# Patient Record
Sex: Male | Born: 1995 | Race: White | Hispanic: No | Marital: Married
Health system: Midwestern US, Community
[De-identification: ages and names within clinical notes are randomized; demographics above are authoritative.]

## PROBLEM LIST (undated history)

## (undated) DIAGNOSIS — A6920 Lyme disease, unspecified: Secondary | ICD-10-CM

## (undated) DIAGNOSIS — K219 Gastro-esophageal reflux disease without esophagitis: Secondary | ICD-10-CM

## (undated) DIAGNOSIS — Z87442 Personal history of urinary calculi: Secondary | ICD-10-CM

## (undated) DIAGNOSIS — L659 Nonscarring hair loss, unspecified: Secondary | ICD-10-CM

## (undated) DIAGNOSIS — F840 Autistic disorder: Secondary | ICD-10-CM

## (undated) DIAGNOSIS — F909 Attention-deficit hyperactivity disorder, unspecified type: Secondary | ICD-10-CM

## (undated) DIAGNOSIS — E039 Hypothyroidism, unspecified: Secondary | ICD-10-CM

## (undated) HISTORY — PX: WISDOM TOOTH EXTRACTION: SHX21

## (undated) HISTORY — DX: Attention-deficit hyperactivity disorder, unspecified type: F90.9

## (undated) HISTORY — DX: Lyme disease, unspecified: A69.20

---

## 2019-09-10 DIAGNOSIS — F840 Autistic disorder: Secondary | ICD-10-CM | POA: Insufficient documentation

## 2019-09-10 DIAGNOSIS — F902 Attention-deficit hyperactivity disorder, combined type: Secondary | ICD-10-CM | POA: Insufficient documentation

## 2019-09-10 DIAGNOSIS — E039 Hypothyroidism, unspecified: Secondary | ICD-10-CM | POA: Insufficient documentation

## 2019-09-12 DIAGNOSIS — L631 Alopecia universalis: Secondary | ICD-10-CM | POA: Insufficient documentation

## 2021-01-26 DIAGNOSIS — R869 Unspecified abnormal finding in specimens from male genital organs: Secondary | ICD-10-CM | POA: Diagnosis not present

## 2021-01-26 DIAGNOSIS — N4611 Organic oligospermia: Secondary | ICD-10-CM | POA: Diagnosis not present

## 2021-02-13 DIAGNOSIS — N4611 Organic oligospermia: Secondary | ICD-10-CM | POA: Diagnosis not present

## 2021-05-21 DIAGNOSIS — Z79899 Other long term (current) drug therapy: Secondary | ICD-10-CM | POA: Diagnosis not present

## 2021-05-21 DIAGNOSIS — E039 Hypothyroidism, unspecified: Secondary | ICD-10-CM | POA: Diagnosis not present

## 2021-07-27 DIAGNOSIS — Z3141 Encounter for fertility testing: Secondary | ICD-10-CM | POA: Diagnosis not present

## 2021-07-27 DIAGNOSIS — N469 Male infertility, unspecified: Secondary | ICD-10-CM | POA: Diagnosis not present

## 2021-08-11 DIAGNOSIS — E039 Hypothyroidism, unspecified: Secondary | ICD-10-CM | POA: Diagnosis not present

## 2021-08-20 DIAGNOSIS — Z79899 Other long term (current) drug therapy: Secondary | ICD-10-CM | POA: Diagnosis not present

## 2021-08-20 DIAGNOSIS — E039 Hypothyroidism, unspecified: Secondary | ICD-10-CM | POA: Diagnosis not present

## 2021-09-07 DIAGNOSIS — E039 Hypothyroidism, unspecified: Secondary | ICD-10-CM | POA: Diagnosis not present

## 2021-09-14 DIAGNOSIS — E039 Hypothyroidism, unspecified: Secondary | ICD-10-CM | POA: Diagnosis not present

## 2021-09-14 DIAGNOSIS — Z79899 Other long term (current) drug therapy: Secondary | ICD-10-CM | POA: Diagnosis not present

## 2021-10-14 DIAGNOSIS — E039 Hypothyroidism, unspecified: Secondary | ICD-10-CM | POA: Diagnosis not present

## 2021-10-22 ENCOUNTER — Emergency Department
Admission: EM | Admit: 2021-10-22 | Discharge: 2021-10-22 | Disposition: A | Discharge status: Home / Self Care | Payer: 59 | Attending: Emergency Medicine | Admitting: Emergency Medicine

## 2021-10-22 ENCOUNTER — Other Ambulatory Visit: Payer: Self-pay

## 2021-10-22 ENCOUNTER — Emergency Department: Payer: 59

## 2021-10-22 ENCOUNTER — Encounter: Payer: Self-pay | Admitting: Emergency Medicine

## 2021-10-22 DIAGNOSIS — R109 Unspecified abdominal pain: Secondary | ICD-10-CM | POA: Diagnosis not present

## 2021-10-22 DIAGNOSIS — R69 Illness, unspecified: Secondary | ICD-10-CM | POA: Diagnosis not present

## 2021-10-22 DIAGNOSIS — F840 Autistic disorder: Secondary | ICD-10-CM | POA: Insufficient documentation

## 2021-10-22 DIAGNOSIS — E039 Hypothyroidism, unspecified: Secondary | ICD-10-CM | POA: Diagnosis not present

## 2021-10-22 DIAGNOSIS — K922 Gastrointestinal hemorrhage, unspecified: Secondary | ICD-10-CM | POA: Diagnosis not present

## 2021-10-22 DIAGNOSIS — K625 Hemorrhage of anus and rectum: Secondary | ICD-10-CM

## 2021-10-22 HISTORY — DX: Autistic disorder: F84.0

## 2021-10-22 HISTORY — DX: Hypothyroidism, unspecified: E03.9

## 2021-10-22 HISTORY — DX: Nonscarring hair loss, unspecified: L65.9

## 2021-10-22 LAB — CBC
HCT: 40.9 % (ref 39.0–52.0)
Hemoglobin: 13.7 g/dL (ref 13.0–17.0)
MCH: 28.4 pg (ref 26.0–34.0)
MCHC: 33.5 g/dL (ref 30.0–36.0)
MCV: 84.9 fL (ref 80.0–100.0)
Platelets: 356 10*3/uL (ref 150–400)
RBC: 4.82 MIL/uL (ref 4.22–5.81)
RDW: 13 % (ref 11.5–15.5)
WBC: 6.6 10*3/uL (ref 4.0–10.5)
nRBC: 0 % (ref 0.0–0.2)

## 2021-10-22 LAB — COMPREHENSIVE METABOLIC PANEL
ALT: 25 U/L (ref 0–44)
AST: 19 U/L (ref 15–41)
Albumin: 4.6 g/dL (ref 3.5–5.0)
Alkaline Phosphatase: 63 U/L (ref 38–126)
Anion gap: 8 (ref 5–15)
BUN: 14 mg/dL (ref 6–20)
CO2: 25 mmol/L (ref 22–32)
Calcium: 9.6 mg/dL (ref 8.9–10.3)
Chloride: 103 mmol/L (ref 98–111)
Creatinine, Ser: 1.03 mg/dL (ref 0.61–1.24)
GFR, Estimated: >60 mL/min (ref >60)
Glucose, Bld: 99 mg/dL (ref 70–99)
Potassium: 3.9 mmol/L (ref 3.5–5.1)
Sodium: 136 mmol/L (ref 135–145)
Total Bilirubin: 0.6 mg/dL (ref 0.3–1.2)
Total Protein: 8 g/dL (ref 6.5–8.1)

## 2021-10-22 LAB — TYPE AND SCREEN
ABO/RH(D): A POS
Antibody Screen: NEGATIVE

## 2021-10-22 MED ORDER — IOHEXOL 300 MG/ML  SOLN
100.0000 mL | Freq: Once | INTRAMUSCULAR | Status: AC | PRN
  Administered 2021-10-22: 100 mL via INTRAVENOUS
  Filled 2021-10-22: qty 100

## 2021-10-22 NOTE — ED Provider Notes (Signed)
[facility] Emergency Department Provider Note  ____________________________________________   Event Date/Time   First MD Initiated Contact with Patient 10/22/21 1227     (approximate)  I have reviewed the triage vital signs and the nursing notes.   HISTORY  Chief Complaint Rectal Bleeding and Abdominal Pain   HPI Keith Huber is a 25 y.o. male with below medical history, presents to the ED with reports of single episode of BRBPR, with associated RLQ, during a bowel movement this morning.  Patient notes that he had "a gush" of blood passed through his rectum.  He describes both dark and bright red blood at the time of the bowel movement.  He denies any frank rectal discomfort.  He did endorse some RLQ pain, described as cramping pain, at the time.  He notes that the last time he had similar symptoms was after he ate some peanuts or cashews.  He denies being evaluated for a work-up at that time.  Patient will report that he ate raw nuts about a week ago, and has of intermittent nausea and vomiting the week of Thanksgiving.  He reports to the ED for evaluation denies any current abdominal pain.  He also denies any associated NV, constipation, hemorrhoid history, colitis, or diverticulitis.  Denies any regular or excessive use of anti-inflammatory pain medicines.  Past Medical History:  Diagnosis Date   Alopecia    Autism    Hypothyroidism     There are no problems to display for this patient.   History reviewed. No pertinent surgical history.  Prior to Admission medications   Not on File    Allergies Vyvanse [lisdexamfetamine]  History reviewed. No pertinent family history.  Social History Social History   Tobacco Use   Smoking status: Never   Smokeless tobacco: Never  Substance Use Topics   Alcohol use: Yes    Comment: occasional   Drug use: Not Currently    Review of Systems  Constitutional: No fever/chills Eyes: No visual  changes. ENT: No sore throat. Cardiovascular: Denies chest pain. Respiratory: Denies shortness of breath. Gastrointestinal: No abdominal pain.  No nausea, no vomiting.  No diarrhea.  No constipation. Genitourinary: Negative for dysuria. Musculoskeletal: Negative for back pain. Skin: Negative for rash. Neurological: Negative for headaches, focal weakness or numbness. ____________________________________________   PHYSICAL EXAM:  VITAL SIGNS: ED Triage Vitals [10/22/21 1127]  Enc Vitals Group     BP      Pulse      Resp      Temp      Temp src      SpO2      Weight 193 lb (87.5 kg)     Height 5' 10.5" (1.791 m)     Head Circumference      Peak Flow      Pain Score 5     Pain Loc      Pain Edu?      Excl. in GC?     Constitutional: Alert and oriented. Well appearing and in no acute distress. Eyes: Conjunctivae are normal. PERRL. EOMI. Head: Atraumatic. Nose: No congestion/rhinnorhea. Mouth/Throat: Mucous membranes are moist.  Oropharynx non-erythematous. Neck: No stridor.   Cardiovascular: Normal rate, regular rhythm. Grossly normal heart sounds.  Good peripheral circulation. Respiratory: Normal respiratory effort.  No retractions. Lungs CTAB. Gastrointestinal: Soft and nontender. No distention. No abdominal bruits. No CVA tenderness.  Bowel sounds appreciated.  Normal rectal tone noted.  Heme positive stool that was light brown to reddish-brown  in color Musculoskeletal: No lower extremity tenderness nor edema.  No joint effusions. Neurologic:  Normal speech and language. No gross focal neurologic deficits are appreciated. No gait instability. Skin:  Skin is warm, dry and intact. No rash noted. Psychiatric: Mood and affect are normal. Speech and behavior are normal.  ____________________________________________   LABS (all labs ordered are listed, but only abnormal results are displayed)  Labs Reviewed  COMPREHENSIVE METABOLIC PANEL  CBC  POC OCCULT BLOOD, ED   TYPE AND SCREEN   ____________________________________________  EKG  ____________________________________________  RADIOLOGY I, Melvenia Needles, personally viewed and evaluated these images (plain radiographs) as part of my medical decision making, as well as reviewing the written report by the radiologist.  ED MD interpretation:  agree with report  Official radiology report(s): CT ABDOMEN PELVIS W CONTRAST  Result Date: 10/22/2021 CLINICAL DATA:  Right lower quadrant abdominal pain and rectal bleeding. EXAM: CT ABDOMEN AND PELVIS WITH CONTRAST TECHNIQUE: Multidetector CT imaging of the abdomen and pelvis was performed using the standard protocol following bolus administration of intravenous contrast. CONTRAST:  156mL OMNIPAQUE IOHEXOL 300 MG/ML  SOLN COMPARISON:  None. FINDINGS: Lower chest: No acute abnormality. Hepatobiliary: No focal liver abnormality is seen. No gallstones, gallbladder wall thickening, or biliary dilatation. Pancreas: Unremarkable. No pancreatic ductal dilatation or surrounding inflammatory changes. Spleen: Normal in size without focal abnormality. Adrenals/Urinary Tract: Adrenal glands are unremarkable. Punctate calculus at the right UVJ. Additional punctate right renal calculi. No hydronephrosis. The bladder is unremarkable for the degree of distention. Stomach/Bowel: Stomach is within normal limits. No evidence of bowel wall thickening, distention, or inflammatory changes. 4 mm appendicolith in the mid appendix. The appendix is otherwise normal without dilatation, wall thickening, or surrounding inflammation. Vascular/Lymphatic: No significant vascular findings are present. No enlarged abdominal or pelvic lymph nodes. Reproductive: Prostate is unremarkable. Other: No abdominal wall hernia or abnormality. No abdominopelvic ascites. No pneumoperitoneum. Musculoskeletal: No acute or significant osseous findings. IMPRESSION: 1. Punctate calculus at the right UVJ. No  hydronephrosis. 2. Additional nonobstructive right nephrolithiasis. Electronically Signed   By: Titus Dubin M.D.   On: 10/22/2021 15:01    ____________________________________________   PROCEDURES  Procedure(s) performed (including Critical Care):  Procedures  ____________________________________________   INITIAL IMPRESSION / ASSESSMENT AND PLAN / ED COURSE  As part of my medical decision making, I reviewed the following data within the [facility] reviewed WNL, Radiograph reviewed NAD, and Notes from prior ED visits   DDX: colitis, hemorrhoids, diverticulitis  Patient ED evaluation episode of BRBPR and right lower quadrant pain.  Patient presents in no acute distress with no active bleeding throughout his course in the ED.  He was evaluated with labs which are overall reassuring no signs of leukocytosis or acute anemia.  No CT evidence of colitis, internal hemorrhoids, or diverticulitis.  The appendix is normal without signs of acute inflammation or infection.  Patient was found to have multiple punctate right renal stones.  Otherwise patient is stable at this time for discharge.  He will follow-up with GI medicine for ongoing evaluation of his symptoms.  Return precautions have been reviewed and patient verbalized understanding.  A work note is provided as requested. ____________________________________________   FINAL CLINICAL IMPRESSION(S) / ED DIAGNOSES  Final diagnoses:  Lower GI bleed  Rectal bleeding     ED Discharge Orders     None        Note:  This document was prepared using Dragon voice recognition software and may  include unintentional dictation errors.    Melvenia Needles, PA-C 10/22/21 1702    Carrie Mew, MD 10/24/21 [phone]

## 2021-10-22 NOTE — ED Triage Notes (Signed)
Pt to ED c/o rectal bleeding and RLQ abdominal pain. Pt is in NAD.

## 2021-10-22 NOTE — ED Triage Notes (Signed)
Pt comes into the ED via POV c/o rectal bleeding RLQ abd pain as well.  PT states it has been ongoing for a couple days.  PT states the pain is sharp.  Denies any known h/o diverticulitis.  Pt states he had a "gush" of blood.  Pt ambulatory to triage at this time and in NAD. Pt states the blood was a mix of bright red and dark red blood.

## 2021-10-22 NOTE — Discharge Instructions (Addendum)
Your exam, labs, and CT were all overall reassuring and normal at this time.  No signs of any ongoing or acute bleeding at this time.  No abnormalities to your red blood cells or any signs of anemia.  Your CT scan did not reveal any signs of acute appendicitis, colitis, or hemorrhoids.  You do have multiple, nonobstructing, small right-sided kidney stones.  You should follow-up with gastroenterology for further evaluation.  Return to the ED for worsening or recurrent symptoms.

## 2021-10-22 NOTE — ED Notes (Signed)
Went to start IV on pt for CT scan- pt states he was supposed to talk to a financial person first to know how much it would cost

## 2021-12-15 DIAGNOSIS — F840 Autistic disorder: Secondary | ICD-10-CM | POA: Diagnosis not present

## 2021-12-15 DIAGNOSIS — F419 Anxiety disorder, unspecified: Secondary | ICD-10-CM | POA: Diagnosis not present

## 2021-12-15 DIAGNOSIS — Z79899 Other long term (current) drug therapy: Secondary | ICD-10-CM | POA: Diagnosis not present

## 2021-12-15 DIAGNOSIS — Z1389 Encounter for screening for other disorder: Secondary | ICD-10-CM | POA: Diagnosis not present

## 2021-12-15 DIAGNOSIS — R69 Illness, unspecified: Secondary | ICD-10-CM | POA: Diagnosis not present

## 2022-01-11 DIAGNOSIS — R69 Illness, unspecified: Secondary | ICD-10-CM | POA: Diagnosis not present

## 2022-01-21 ENCOUNTER — Other Ambulatory Visit: Payer: Self-pay

## 2022-01-21 ENCOUNTER — Encounter: Payer: Self-pay | Admitting: Gastroenterology

## 2022-01-21 ENCOUNTER — Ambulatory Visit (INDEPENDENT_AMBULATORY_CARE_PROVIDER_SITE_OTHER): Payer: 59 | Admitting: Gastroenterology

## 2022-01-21 VITALS — BP 118/80 | HR 84 | Temp 98.4°F | Ht 70.5 in | Wt 191.0 lb

## 2022-01-21 DIAGNOSIS — K219 Gastro-esophageal reflux disease without esophagitis: Secondary | ICD-10-CM | POA: Diagnosis not present

## 2022-01-21 DIAGNOSIS — K625 Hemorrhage of anus and rectum: Secondary | ICD-10-CM | POA: Diagnosis not present

## 2022-01-21 MED ORDER — NA SULFATE-K SULFATE-MG SULF 17.5-3.13-1.6 GM/177ML PO SOLN: 1.0000 | Freq: Once | ORAL | 0 refills | Status: AC

## 2022-01-21 NOTE — Progress Notes (Signed)
? ? ?Gastroenterology Consultation ? ?Referring Provider:     Orlinda Blalock M.D. ?Primary Care Physician:  Pcp, No ?Primary Gastroenterologist:  Dr. Allen Norris     ?Reason for Consultation:     Rectal bleeding ?      ? HPI:   ?Keith Huber is a 26 y.o. y/o male referred for consultation & management of Rectal bleeding by Dr. Merryl Hacker, No.  This patient comes in to see me today after being seen in the emergency department back in December 2022.  At that time the patient was in the emergency department with bright red blood per rectum and reported right lower quadrant pain.  He had reported that he had a gush of blood that was bright red blood with some dark material.  He also had crampy right lower quadrant pain at that time.  He had a similar episode in the past when he any in some nuts and had similar symptoms about a week prior to the visit to the emergency room with intermittent nausea and vomiting.  When he was evaluated at the ED he was not having any further pain nor was he having any change in bowel habits such as constipation or diarrhea. ?The patient had blood work sent off when in the ED and had a normal CBC and a normal CMP and a CT scan that showed: ? ?IMPRESSION: ?1. Punctate calculus at the right UVJ. No hydronephrosis. ?2. Additional nonobstructive right nephrolithiasis. ?  ?He has bleeding about once a month. He says it is not server. No family hisotyr of colon cancer or IBD. He reports tha blood is mixed and painted on the stools at times. ?He says he has chronic reflux since childhood but only needs to take something for it once a month. His pain has resolved since passing the kidney stones. ? ?The patient denies any unexplained weight loss but does state he lost some weight with his new anxiety medication.  The patient also suffers from ADHD.  He denies any family history of Crohn's disease or ulcerative colitis.  He also denies any family history of colon cancer or colon polyps. ? ? ?Past Medical  History:  ?Diagnosis Date  ? Alopecia   ? Autism   ? Hypothyroidism   ? ? ?No past surgical history on file. ? ?Prior to Admission medications   ?Not on File  ? ? ?No family history on file.  ? ?Social History  ? ?Tobacco Use  ? Smoking status: Never  ? Smokeless tobacco: Never  ?Substance Use Topics  ? Alcohol use: Yes  ?  Comment: occasional  ? Drug use: Not Currently  ? ? ?Allergies as of 01/21/2022 - Review Complete 10/22/2021  ?Allergen Reaction Noted  ? Vyvanse [lisdexamfetamine]  10/22/2021  ? ? ?Review of Systems:    ?All systems reviewed and negative except where noted in HPI. ? ? Physical Exam:  ?There were no vitals taken for this visit. ?No LMP for male patient. ?General:   Alert,  Well-developed, well-nourished, pleasant and cooperative in NAD ?Head:  Normocephalic and atraumatic. ?Eyes:  Sclera clear, no icterus.   Conjunctiva pink. ?Ears:  Normal auditory acuity. ?Neck:  Supple; no masses or thyromegaly. ?Lungs:  Respirations even and unlabored.  Clear throughout to auscultation.   No wheezes, crackles, or rhonchi. No acute distress. ?Heart:  Regular rate and rhythm; no murmurs, clicks, rubs, or gallops. ?Abdomen:  Normal bowel sounds.  No bruits.  Soft, non-tender and non-distended without masses, hepatosplenomegaly or hernias noted.  No guarding or rebound tenderness.  Negative Carnett sign.   ?Rectal:  Deferred.  ?Pulses:  Normal pulses noted. ?Extremities:  No clubbing or edema.  No cyanosis. ?Neurologic:  Alert and oriented x3;  grossly normal neurologically. ?Skin:  Intact without significant lesions or rashes.  No jaundice. ?Lymph Nodes:  No significant cervical adenopathy. ?Psych:  Alert and cooperative. Normal mood and affect. ? ?Imaging Studies: ?No results found. ? ?Assessment and Plan:  ? ?[name] is a 26 y.o. y/o male who comes in with recurrent rectal bleeding.  The patient also has heartburn but he only needs to take something for it approximately once a month.  Due to his  recurrent rectal bleed the patient will be set up for colonoscopy to look for source of the rectal bleeding.  The patient has both bright red blood per rectum and blood mixed with the stools which is less likely caused by hemorrhoids but could also be hemorrhoidal bleeding.  The patient has been explained the plan and agrees with it. ? ? ? ?Lucilla Lame, MD. Marval Regal ? ? ? Note: This dictation was prepared with Dragon dictation along with smaller phrase technology. Any transcriptional errors that result from this process are unintentional.   ?

## 2022-01-21 NOTE — Addendum Note (Signed)
Addended by: Roena Malady on: 01/21/2022 01:32 PM ? ? Modules accepted: Orders ? ?

## 2022-01-26 DIAGNOSIS — R69 Illness, unspecified: Secondary | ICD-10-CM | POA: Diagnosis not present

## 2022-03-24 ENCOUNTER — Emergency Department: Payer: 59

## 2022-03-24 ENCOUNTER — Other Ambulatory Visit: Payer: Self-pay

## 2022-03-24 ENCOUNTER — Emergency Department
Admission: EM | Admit: 2022-03-24 | Discharge: 2022-03-24 | Disposition: A | Discharge status: Home / Self Care | Payer: 59 | Attending: Emergency Medicine | Admitting: Emergency Medicine

## 2022-03-24 DIAGNOSIS — R7989 Other specified abnormal findings of blood chemistry: Secondary | ICD-10-CM | POA: Diagnosis not present

## 2022-03-24 DIAGNOSIS — N200 Calculus of kidney: Secondary | ICD-10-CM

## 2022-03-24 DIAGNOSIS — R1031 Right lower quadrant pain: Secondary | ICD-10-CM | POA: Diagnosis present

## 2022-03-24 DIAGNOSIS — N132 Hydronephrosis with renal and ureteral calculous obstruction: Secondary | ICD-10-CM | POA: Diagnosis not present

## 2022-03-24 DIAGNOSIS — N202 Calculus of kidney with calculus of ureter: Secondary | ICD-10-CM | POA: Insufficient documentation

## 2022-03-24 DIAGNOSIS — D72829 Elevated white blood cell count, unspecified: Secondary | ICD-10-CM | POA: Insufficient documentation

## 2022-03-24 LAB — BASIC METABOLIC PANEL
Anion gap: 10 (ref 5–15)
BUN: 17 mg/dL (ref 6–20)
CO2: 23 mmol/L (ref 22–32)
Calcium: 9.4 mg/dL (ref 8.9–10.3)
Chloride: 100 mmol/L (ref 98–111)
Creatinine, Ser: 1.42 mg/dL — ABNORMAL HIGH (ref 0.61–1.24)
GFR, Estimated: >60 mL/min (ref >60)
Glucose, Bld: 138 mg/dL — ABNORMAL HIGH (ref 70–99)
Potassium: 3.9 mmol/L (ref 3.5–5.1)
Sodium: 133 mmol/L — ABNORMAL LOW (ref 135–145)

## 2022-03-24 LAB — CBC
HCT: 40.3 % (ref 39.0–52.0)
Hemoglobin: 13.2 g/dL (ref 13.0–17.0)
MCH: 27.8 pg (ref 26.0–34.0)
MCHC: 32.8 g/dL (ref 30.0–36.0)
MCV: 84.8 fL (ref 80.0–100.0)
Platelets: 333 10*3/uL (ref 150–400)
RBC: 4.75 MIL/uL (ref 4.22–5.81)
RDW: 13.2 % (ref 11.5–15.5)
WBC: 12.1 10*3/uL — ABNORMAL HIGH (ref 4.0–10.5)
nRBC: 0 % (ref 0.0–0.2)

## 2022-03-24 LAB — URINALYSIS, ROUTINE W REFLEX MICROSCOPIC
Bilirubin Urine: NEGATIVE
Glucose, UA: NEGATIVE mg/dL
Ketones, ur: NEGATIVE mg/dL
Leukocytes,Ua: NEGATIVE
Nitrite: NEGATIVE
Protein, ur: NEGATIVE mg/dL
RBC / HPF: >50 RBC/hpf — ABNORMAL HIGH (ref 0–5)
Specific Gravity, Urine: 1.021 (ref 1.005–1.030)
Squamous Epithelial / HPF: NONE SEEN (ref 0–5)
pH: 6 (ref 5.0–8.0)

## 2022-03-24 MED ORDER — ONDANSETRON HCL 4 MG/2ML IJ SOLN
4.0000 mg | Freq: Once | INTRAMUSCULAR | Status: AC
  Administered 2022-03-24: 4 mg via INTRAVENOUS

## 2022-03-24 MED ORDER — ONDANSETRON 4 MG PO TBDP: 4.0000 mg | ORAL_TABLET | Freq: Three times a day (TID) | ORAL | 0 refills | Status: AC | PRN

## 2022-03-24 MED ORDER — OXYCODONE-ACETAMINOPHEN 5-325 MG PO TABS
1.0000 | ORAL_TABLET | Freq: Once | ORAL | Status: AC
  Administered 2022-03-24: 1 via ORAL
  Filled 2022-03-24: qty 1

## 2022-03-24 MED ORDER — HYDROMORPHONE HCL 1 MG/ML IJ SOLN
0.5000 mg | Freq: Once | INTRAMUSCULAR | Status: AC
Start: 2022-03-24 — End: 2022-03-24
  Administered 2022-03-24: 0.5 mg via INTRAVENOUS

## 2022-03-24 MED ORDER — HYDROMORPHONE HCL 1 MG/ML IJ SOLN
INTRAMUSCULAR | Status: AC
  Filled 2022-03-24: qty 0.5

## 2022-03-24 MED ORDER — ONDANSETRON HCL 4 MG/2ML IJ SOLN
INTRAMUSCULAR | Status: AC
  Filled 2022-03-24: qty 2

## 2022-03-24 MED ORDER — KETOROLAC TROMETHAMINE 15 MG/ML IJ SOLN
15.0000 mg | Freq: Once | INTRAMUSCULAR | Status: AC
  Administered 2022-03-24: 15 mg via INTRAVENOUS

## 2022-03-24 MED ORDER — OXYCODONE HCL 5 MG PO TABS: 5.0000 mg | ORAL_TABLET | Freq: Four times a day (QID) | ORAL | 0 refills | Status: AC | PRN

## 2022-03-24 MED ORDER — SODIUM CHLORIDE 0.9 % IV BOLUS
1000.0000 mL | Freq: Once | INTRAVENOUS | Status: AC
  Administered 2022-03-24: 1000 mL via INTRAVENOUS

## 2022-03-24 MED ORDER — KETOROLAC TROMETHAMINE 15 MG/ML IJ SOLN
INTRAMUSCULAR | Status: AC
  Filled 2022-03-24: qty 1

## 2022-03-24 MED ORDER — TAMSULOSIN HCL 0.4 MG PO CAPS: 0.4000 mg | ORAL_CAPSULE | Freq: Every day | ORAL | 0 refills | Status: AC

## 2022-03-24 NOTE — ED Notes (Signed)
Pt is c/o right sided abd pain and right flank pain, reports hx of kidney stone on the same side and is uncertain if he passed the stone or not ?

## 2022-03-24 NOTE — Discharge Instructions (Addendum)
You have a kidney stone. See report below.   ?Take tylenol 1g every 8 hours daily. ?Your kidney function is slightly elevated so occasional ibuprofen is okay 400mg  1-2 times a day.  ?Take oxycodone for breakthrough pain. Do not drive, work, or operate machinery while on this.  ?Take zofran to help with nausea. ?Take Flomax to help dilate uretha. ?Call urology number above to schedule outpatient appointment. ?Return to ED for fevers, unable to keep food down, or any other concerns.  ? ?Take oxycodone as prescribed. Do not drink alcohol, drive or participate in any other potentially dangerous activities while taking this medication as it may make you sleepy. Do not take this medication with any other sedating medications, either prescription or over-the-counter. If you were prescribed Percocet or Vicodin, do not take these with acetaminophen (Tylenol) as it is already contained within these medications. ? ?This medication is an opiate (or narcotic) pain medication and can be habit forming. Use it as little as possible to achieve adequate pain control. Do not use or use it with extreme caution if you have a history of opiate abuse or dependence. If you are on a pain contract with your primary care doctor or a pain specialist, be sure to let them know you were prescribed this medication today from the Emergency Department. This medication is intended for your use only - do not give any to anyone else and keep it in a secure place where nobody else, especially children, have access to it. ? ?IMPRESSION: ?1. Minimally obstructing 3 mm distal right ureteral stone, at the ?ureterovesical junction. ?2. Bilateral renal stones. ?  ?

## 2022-03-24 NOTE — ED Triage Notes (Signed)
Pt c/o right flank pain with N/V for the past several days, worse today. States he has a hx of kidney stones and this feels the same "I have a kidney stone that is kicking my ass" ?

## 2022-03-24 NOTE — ED Provider Notes (Signed)
? ?[facility] ?Provider Note ? ? ? Event Date/Time  ? First MD Initiated Contact with Patient 03/24/22 1348   ?  (approximate) ? ? ?History  ? ?Flank Pain ? ? ?HPI ? ?Keith Huber is a 26 y.o. male who comes in with concerns for right flank pain with nausea and vomiting for the past several days.  Patient reports having history of kidney stones and this feels the same.  Patient reports the pain is now more in his right groin area more than his back.  Denies any fevers. ? ? ? ?Physical Exam  ? ?Triage Vital Signs: ?ED Triage Vitals [03/24/22 1227]  ?Enc Vitals Group  ?   BP (!) 128/94  ?   Pulse Rate 65  ?   Resp 18  ?   Temp 98.1 ?F (36.7 ?C)  ?   Temp Source Oral  ?   SpO2 96 %  ?   Weight   ?   Height   ?   Head Circumference   ?   Peak Flow   ?   Pain Score   ?   Pain Loc   ?   Pain Edu?   ?   Excl. in GC?   ? ? ?Most recent vital signs: ?Vitals:  ? 03/24/22 1227  ?BP: (!) 128/94  ?Pulse: 65  ?Resp: 18  ?Temp: 98.1 ?F (36.7 ?C)  ?SpO2: 96%  ? ? ? ?General: Awake, no distress.  ?CV:  Good peripheral perfusion.  ?Resp:  Normal effort.  ?Abd:  No distention.  Soft nontender ?Other:  No swelling of his testicles. ? ? ?ED Results / Procedures / Treatments  ? ?Labs ?(all labs ordered are listed, but only abnormal results are displayed) ?Labs Reviewed  ?BASIC METABOLIC PANEL - Abnormal; Notable for the following components:  ?    Result Value  ? Sodium 133 (*)   ? Glucose, Bld 138 (*)   ? Creatinine, Ser 1.42 (*)   ? All other components within normal limits  ?CBC - Abnormal; Notable for the following components:  ? WBC 12.1 (*)   ? All other components within normal limits  ?URINALYSIS, ROUTINE W REFLEX MICROSCOPIC  ? ? ? ?RADIOLOGY ?I personally reviewed the CT imaging and see a kidney stone on the right ? ?PROCEDURES: ? ?Critical Care performed: No ? ?Procedures ? ? ?MEDICATIONS ORDERED IN ED: ?Medications  ?oxyCODONE-acetaminophen (PERCOCET/ROXICET) 5-325 MG per tablet 1 tablet (1 tablet Oral  Given 03/24/22 1239)  ?sodium chloride 0.9 % bolus 1,000 mL (1,000 mLs Intravenous New Bag/Given 03/24/22 1429)  ?HYDROmorphone (DILAUDID) injection 0.5 mg (0.5 mg Intravenous Given 03/24/22 1430)  ?ondansetron (ZOFRAN) injection 4 mg (4 mg Intravenous Given 03/24/22 1429)  ? ? ? ?IMPRESSION / MDM / ASSESSMENT AND PLAN / ED COURSE  ?I reviewed the triage vital signs and the nursing notes. ? ? ?Patient comes in with what sounds a kidney stone but also consider appendicitis, UTI.  Will get labs, urine, CT renal to further evaluate.  Patient still having pain after the dose of Percocet so we will put an IV give IV fluids IV Zofran and IV Dilaudid to help with symptoms ? ?IMPRESSION: ?1. Minimally obstructing 3 mm distal right ureteral stone, at the ?ureterovesical junction. ?2. Bilateral renal stones. ?  ?Slightly elevated white count but waiting on UA to make sure no UTI.  Creatinine is slightly elevated on BMP but normal GFR so we will give 1 dose of Toradol I  will have patient be on a lower dose of ibuprofen as needed for pain.  Suspect this is related to little dehydration patient getting IV fluids ? ?Patient feeling better after the Dilaudid.  We will give a dose of Toradol to help as well.  Suspect patient will be discharged home after urine given no fever and low suspicion for concurrent infection.  We discussed return precautions in regards to kidney stones and have him follow-up with urology. ? ?Patient handed off to oncoming team pending reassessment and UA ? ? ? ?FINAL CLINICAL IMPRESSION(S) / ED DIAGNOSES  ? ?Final diagnoses:  ?Kidney stone  ? ? ? ?Rx / DC Orders  ? ?ED Discharge Orders   ? ?      Ordered  ?  oxyCODONE (ROXICODONE) 5 MG immediate release tablet  Every 6 hours PRN       ? 03/24/22 1439  ?  ondansetron (ZOFRAN-ODT) 4 MG disintegrating tablet  Every 8 hours PRN       ? 03/24/22 1439  ?  tamsulosin (FLOMAX) 0.4 MG CAPS capsule  Daily       ? 03/24/22 1439  ? ?  ?  ? ?  ? ? ? ?Note:  This document  was prepared using Dragon voice recognition software and may include unintentional dictation errors. ?  ?Concha Se, MD ?03/24/22 1442 ? ?

## 2022-03-24 NOTE — ED Provider Notes (Signed)
Patient signed out to me pending a urine.  He has a 3 mm kidney stone with minimal obstruction. ? ?My assessment patient's pain is well controlled.  Patient is able to provide a urine sample has 0 white blood cells.  Presentation not consistent with septic stone.  He is appropriate for discharge. ?  ?Georga Hacking, MD ?03/24/22 1609 ? ?

## 2022-04-07 DIAGNOSIS — R69 Illness, unspecified: Secondary | ICD-10-CM | POA: Diagnosis not present

## 2022-04-07 DIAGNOSIS — F900 Attention-deficit hyperactivity disorder, predominantly inattentive type: Secondary | ICD-10-CM | POA: Diagnosis not present

## 2022-04-07 DIAGNOSIS — F840 Autistic disorder: Secondary | ICD-10-CM | POA: Diagnosis not present

## 2022-06-09 ENCOUNTER — Encounter: Payer: Self-pay | Admitting: Gastroenterology

## 2022-06-09 ENCOUNTER — Other Ambulatory Visit: Payer: Self-pay

## 2022-06-09 DIAGNOSIS — K625 Hemorrhage of anus and rectum: Secondary | ICD-10-CM

## 2022-06-13 NOTE — Anesthesia Preprocedure Evaluation (Signed)
Anesthesia Evaluation  Patient identified by MRN, date of birth, ID band Patient awake    Reviewed: Allergy & Precautions, NPO status , Patient's Chart, lab work & pertinent test results  Airway Mallampati: III  TM Distance: >3 FB Neck ROM: full    Dental  (+) Chipped   Pulmonary neg pulmonary ROS,    Pulmonary exam normal        Cardiovascular negative cardio ROS Normal cardiovascular exam     Neuro/Psych PSYCHIATRIC DISORDERS Autismnegative neurological ROS     GI/Hepatic Neg liver ROS, GERD  ,  Endo/Other  Hypothyroidism   Renal/GU Renal disease hx of kidney stone with elevated Cr noted 03/2022  negative genitourinary   Musculoskeletal   Abdominal   Peds  Hematology negative hematology ROS (+)   Anesthesia Other Findings Past Medical History: No date: Alopecia No date: Autism No date: GERD (gastroesophageal reflux disease) No date: History of kidney stones No date: Hypothyroidism  Past Surgical History: No date: WISDOM TOOTH EXTRACTION  BMI    Body Mass Index: 27.02 kg/m      Reproductive/Obstetrics negative OB ROS                             Anesthesia Physical Anesthesia Plan  ASA: 2  Anesthesia Plan: General   Post-op Pain Management:    Induction:   PONV Risk Score and Plan: Propofol infusion and TIVA  Airway Management Planned: Natural Airway  Additional Equipment:   Intra-op Plan:   Post-operative Plan:   Informed Consent:     Dental Advisory Given  Plan Discussed with: Anesthesiologist, CRNA and Surgeon  Anesthesia Plan Comments:         Anesthesia Quick Evaluation

## 2022-06-14 ENCOUNTER — Encounter: Payer: Self-pay | Admitting: Gastroenterology

## 2022-06-14 ENCOUNTER — Encounter
Admission: RE | Disposition: A | Discharge status: Home / Self Care | Payer: Self-pay | Source: Home / Self Care | Attending: Gastroenterology

## 2022-06-14 ENCOUNTER — Ambulatory Visit (AMBULATORY_SURGERY_CENTER): Payer: 59 | Admitting: Anesthesiology

## 2022-06-14 ENCOUNTER — Ambulatory Visit: Payer: 59 | Admitting: Anesthesiology

## 2022-06-14 ENCOUNTER — Other Ambulatory Visit: Payer: Self-pay

## 2022-06-14 ENCOUNTER — Ambulatory Visit
Admission: RE | Admit: 2022-06-14 | Discharge: 2022-06-14 | Disposition: A | Discharge status: Home / Self Care | Payer: 59 | Attending: Gastroenterology | Admitting: Gastroenterology

## 2022-06-14 DIAGNOSIS — Z87442 Personal history of urinary calculi: Secondary | ICD-10-CM | POA: Insufficient documentation

## 2022-06-14 DIAGNOSIS — F840 Autistic disorder: Secondary | ICD-10-CM | POA: Diagnosis not present

## 2022-06-14 DIAGNOSIS — K921 Melena: Secondary | ICD-10-CM | POA: Diagnosis not present

## 2022-06-14 DIAGNOSIS — K640 First degree hemorrhoids: Secondary | ICD-10-CM

## 2022-06-14 DIAGNOSIS — L659 Nonscarring hair loss, unspecified: Secondary | ICD-10-CM | POA: Insufficient documentation

## 2022-06-14 DIAGNOSIS — R69 Illness, unspecified: Secondary | ICD-10-CM | POA: Diagnosis not present

## 2022-06-14 DIAGNOSIS — E039 Hypothyroidism, unspecified: Secondary | ICD-10-CM | POA: Insufficient documentation

## 2022-06-14 DIAGNOSIS — K625 Hemorrhage of anus and rectum: Secondary | ICD-10-CM

## 2022-06-14 DIAGNOSIS — K219 Gastro-esophageal reflux disease without esophagitis: Secondary | ICD-10-CM | POA: Insufficient documentation

## 2022-06-14 HISTORY — DX: Personal history of urinary calculi: Z87.442

## 2022-06-14 HISTORY — DX: Gastro-esophageal reflux disease without esophagitis: K21.9

## 2022-06-14 HISTORY — PX: COLONOSCOPY WITH PROPOFOL: SHX5780

## 2022-06-14 SURGERY — COLONOSCOPY WITH PROPOFOL
Anesthesia: General | Site: Rectum

## 2022-06-14 MED ORDER — PROPOFOL 10 MG/ML IV BOLUS
INTRAVENOUS | Status: DC | PRN
  Administered 2022-06-14: 150 ug/kg/min via INTRAVENOUS

## 2022-06-14 MED ORDER — LACTATED RINGERS IV SOLN: INTRAVENOUS | Status: DC

## 2022-06-14 MED ORDER — SODIUM CHLORIDE 0.9 % IV SOLN: INTRAVENOUS | Status: DC

## 2022-06-14 MED ORDER — LIDOCAINE HCL (CARDIAC) PF 100 MG/5ML IV SOSY
PREFILLED_SYRINGE | INTRAVENOUS | Status: DC | PRN
  Administered 2022-06-14: 50 mg via INTRAVENOUS

## 2022-06-14 MED ORDER — STERILE WATER FOR IRRIGATION IR SOLN
Status: DC | PRN
  Administered 2022-06-14: 100 mL

## 2022-06-14 SURGICAL SUPPLY — 6 items
GOWN CVR UNV OPN BCK APRN NK (MISCELLANEOUS) ×2 IMPLANT
GOWN ISOL THUMB LOOP REG UNIV (MISCELLANEOUS) ×4
KIT PRC NS LF DISP ENDO (KITS) ×1 IMPLANT
KIT PROCEDURE OLYMPUS (KITS) ×2
MANIFOLD NEPTUNE II (INSTRUMENTS) ×2 IMPLANT
WATER STERILE IRR 250ML POUR (IV SOLUTION) ×2 IMPLANT

## 2022-06-14 NOTE — Transfer of Care (Signed)
Immediate Anesthesia Transfer of Care Note  Patient: Keith Huber  Procedure(s) Performed: COLONOSCOPY WITH PROPOFOL (Rectum)  Patient Location: PACU  Anesthesia Type: General  Level of Consciousness: awake, alert  and patient cooperative  Airway and Oxygen Therapy: Patient Spontanous Breathing and Patient connected to supplemental oxygen  Post-op Assessment: Post-op Vital signs reviewed, Patient's Cardiovascular Status Stable, Respiratory Function Stable, Patent Airway and No signs of Nausea or vomiting  Post-op Vital Signs: Reviewed and stable  Complications: No notable events documented.

## 2022-06-14 NOTE — Anesthesia Postprocedure Evaluation (Signed)
Anesthesia Post Note  Patient: Keith Huber  Procedure(s) Performed: COLONOSCOPY WITH PROPOFOL (Rectum)     Patient location during evaluation: PACU Anesthesia Type: General Level of consciousness: awake and alert Pain management: pain level controlled Vital Signs Assessment: post-procedure vital signs reviewed and stable Respiratory status: spontaneous breathing, nonlabored ventilation and respiratory function stable Cardiovascular status: blood pressure returned to baseline and stable Postop Assessment: no apparent nausea or vomiting Anesthetic complications: no   No notable events documented.  Foye Deer

## 2022-06-14 NOTE — Op Note (Signed)
[facility] Patient Name: Keith Huber Procedure Date: 06/14/2022 10:09 AM MRN: [id number] Account #: [id number] Date of Birth: 11-21-1995 Admit Type: Outpatient Age: 26 Room: [facility] OR ROOM 01 Gender: Male Note Status: Finalized Instrument Name: [id number] Procedure:             Colonoscopy Indications:           Hematochezia Providers:             Midge Minium MD, MD Medicines:             Propofol per Anesthesia Complications:         No immediate complications. Procedure:             Pre-Anesthesia Assessment:                        - Prior to the procedure, a History and Physical was                         performed, and patient medications and allergies were                         reviewed. The patient's tolerance of previous                         anesthesia was also reviewed. The risks and benefits                         of the procedure and the sedation options and risks                         were discussed with the patient. All questions were                         answered, and informed consent was obtained. Prior                         Anticoagulants: The patient has taken no previous                         anticoagulant or antiplatelet agents. ASA Grade                         Assessment: II - A patient with mild systemic disease.                         After reviewing the risks and benefits, the patient                         was deemed in satisfactory condition to undergo the                         procedure.                        After obtaining informed consent, the colonoscope was                         passed under direct vision. Throughout the procedure,  the patient's blood pressure, pulse, and oxygen                         saturations were monitored continuously. The was                         introduced through the anus and advanced to the the                         terminal ileum. The  colonoscopy was performed without                         difficulty. The patient tolerated the procedure well.                         The quality of the bowel preparation was excellent. Findings:      The perianal and digital rectal examinations were normal.      Non-bleeding internal hemorrhoids were found during retroflexion. The       hemorrhoids were Grade I (internal hemorrhoids that do not prolapse).      The exam was otherwise without abnormality.      The terminal ileum appeared normal. Impression:            - Non-bleeding internal hemorrhoids.                        - The examination was otherwise normal.                        - The examined portion of the ileum was normal.                        - No specimens collected. Recommendation:        - Discharge patient to home.                        - Resume previous diet.                        - Continue present medications.                        - High fiber diet. Procedure Code(s):     --- Professional ---                        [phone], Colonoscopy, flexible; diagnostic, including                         collection of specimen(s) by brushing or washing, when                         performed (separate procedure) Diagnosis Code(s):     --- Professional ---                        K92.1, Melena (includes Hematochezia) CPT copyright 2019 American Medical Association. All rights reserved. The codes documented in this report are preliminary and upon coder review may  be revised to meet current compliance requirements. Midge Minium MD, MD 06/14/2022 10:30:02 AM This report has been  signed electronically. Number of Addenda: 0 Note Initiated On: 06/14/2022 10:09 AM Scope Withdrawal Time: 0 hours 7 minutes 49 seconds  Total Procedure Duration: 0 hours 9 minutes 34 seconds  Estimated Blood Loss:  Estimated blood loss: none.      [facility]

## 2022-06-14 NOTE — H&P (Signed)
   Keith Minium, MD [facility] [address] [place], [place] [address] Phone:[phone] Fax : [phone]  Primary Care Physician:  Pcp, No Primary Gastroenterologist:  Dr. Servando Snare  Pre-Procedure History & Physical: HPI:  Keith Huber is a 26 y.o. male is here for an colonoscopy.   Past Medical History:  Diagnosis Date   Alopecia    Autism    GERD (gastroesophageal reflux disease)    History of kidney stones    Hypothyroidism     Past Surgical History:  Procedure Laterality Date   WISDOM TOOTH EXTRACTION      Prior to Admission medications   Medication Sig Start Date End Date Taking? Authorizing Provider  dexmethylphenidate (FOCALIN XR) 10 MG 24 hr capsule Take 10 mg by mouth every morning. 01/11/22  Yes [provider]  levothyroxine (SYNTHROID) 125 MCG tablet Take 1 tablet by mouth daily. 01/23/21  Yes [provider]  buPROPion (WELLBUTRIN XL) 150 MG 24 hr tablet Take 150 mg by mouth every morning. Patient not taking: Reported on 06/09/2022 01/11/22   [provider]    Allergies as of 06/09/2022 - Review Complete 06/09/2022  Allergen Reaction Noted   Vyvanse [lisdexamfetamine]  10/22/2021    History reviewed. No pertinent family history.  Social History   Socioeconomic History   Marital status: Married    Spouse name: Not on file   Number of children: Not on file   Years of education: Not on file   Highest education level: Not on file  Occupational History   Not on file  Tobacco Use   Smoking status: Never   Smokeless tobacco: Never  Vaping Use   Vaping Use: Never used  Substance and Sexual Activity   Alcohol use: Yes    Comment: occasional   Drug use: Not Currently   Sexual activity: Not on file  Other Topics Concern   Not on file  Social History Narrative   Not on file   Social Determinants of Health   Financial Resource Strain: Not on file  Food Insecurity: Not on file  Transportation Needs: Not on file  Physical  Activity: Not on file  Stress: Not on file  Social Connections: Not on file  Intimate Partner Violence: Not on file    Review of Systems: See HPI, otherwise negative ROS  Physical Exam: BP 120/81   Pulse 76   Temp 97.9 F (36.6 C) (Temporal)   Ht 5' 10.5" (1.791 m)   Wt 87.3 kg   SpO2 99%   BMI 27.22 kg/m  General:   Alert,  pleasant and cooperative in NAD Head:  Normocephalic and atraumatic. Neck:  Supple; no masses or thyromegaly. Lungs:  Clear throughout to auscultation.    Heart:  Regular rate and rhythm. Abdomen:  Soft, nontender and nondistended. Normal bowel sounds, without guarding, and without rebound.   Neurologic:  Alert and  oriented x4;  grossly normal neurologically.  Impression/Plan: Keith Huber is here for an colonoscopy to be performed for rectal bleeding  Risks, benefits, limitations, and alternatives regarding  colonoscopy have been reviewed with the patient.  Questions have been answered.  All parties agreeable.   Keith Minium, MD  06/14/2022, 9:45 AM

## 2022-06-30 DIAGNOSIS — F419 Anxiety disorder, unspecified: Secondary | ICD-10-CM | POA: Diagnosis not present

## 2022-06-30 DIAGNOSIS — R69 Illness, unspecified: Secondary | ICD-10-CM | POA: Diagnosis not present

## 2022-06-30 DIAGNOSIS — F840 Autistic disorder: Secondary | ICD-10-CM | POA: Diagnosis not present

## 2022-06-30 DIAGNOSIS — F909 Attention-deficit hyperactivity disorder, unspecified type: Secondary | ICD-10-CM | POA: Diagnosis not present

## 2022-10-04 DIAGNOSIS — F909 Attention-deficit hyperactivity disorder, unspecified type: Secondary | ICD-10-CM | POA: Diagnosis not present

## 2022-10-04 DIAGNOSIS — R69 Illness, unspecified: Secondary | ICD-10-CM | POA: Diagnosis not present

## 2022-12-08 ENCOUNTER — Encounter: Payer: Self-pay | Admitting: Nurse Practitioner

## 2022-12-08 ENCOUNTER — Ambulatory Visit: Payer: Managed Care, Other (non HMO) | Admitting: Nurse Practitioner

## 2022-12-08 VITALS — BP 122/88 | HR 86 | Ht 70.5 in | Wt 212.0 lb

## 2022-12-08 DIAGNOSIS — Z0000 Encounter for general adult medical examination without abnormal findings: Secondary | ICD-10-CM | POA: Diagnosis not present

## 2022-12-08 DIAGNOSIS — F902 Attention-deficit hyperactivity disorder, combined type: Secondary | ICD-10-CM | POA: Diagnosis not present

## 2022-12-08 DIAGNOSIS — L631 Alopecia universalis: Secondary | ICD-10-CM

## 2022-12-08 DIAGNOSIS — E663 Overweight: Secondary | ICD-10-CM

## 2022-12-08 DIAGNOSIS — F840 Autistic disorder: Secondary | ICD-10-CM

## 2022-12-08 DIAGNOSIS — E039 Hypothyroidism, unspecified: Secondary | ICD-10-CM

## 2022-12-08 MED ORDER — DEXMETHYLPHENIDATE HCL ER 10 MG PO CP24: 10.0000 mg | ORAL_CAPSULE | Freq: Every morning | ORAL | 0 refills | Status: AC

## 2022-12-08 MED ORDER — DEXMETHYLPHENIDATE HCL ER 10 MG PO CP24: 10.0000 mg | ORAL_CAPSULE | Freq: Every day | ORAL | 0 refills | Status: AC

## 2022-12-08 MED ORDER — LEVOTHYROXINE SODIUM 150 MCG PO TABS
150.0000 ug | ORAL_TABLET | Freq: Every day | ORAL | 0 refills | Status: DC
Start: 2022-12-08 — End: 2023-09-14

## 2022-12-08 NOTE — Assessment & Plan Note (Signed)
Patient was maintained on 150 mcg levothyroxine daily.  Has been out of medication we will refill medication.  Pending TSH today

## 2022-12-08 NOTE — Assessment & Plan Note (Signed)
Patient was evaluated by psychiatry which and is now out of network.  He is on Focalin XR.  Will continue medication.  Record release signed today to verify diagnosis.  Once records gotten reviewed I will continue managing medication for patient.

## 2022-12-08 NOTE — Assessment & Plan Note (Signed)
Discussed age-appropriate immunizations and screening exams.  Patient is up-to-date on Tdap.  Patient refused COVID and flu vaccine.  Patient was given information at discharge in regards to preventative healthcare maintenance with anticipatory guidance for his age range.

## 2022-12-08 NOTE — Patient Instructions (Addendum)
Nice to see you today I will be in touch with the labs once I have them Follow up with me in 3 months for medication recheck , sooner if you need me

## 2022-12-08 NOTE — Assessment & Plan Note (Signed)
Work on healthy lifestyle modifications.  Pending A1c

## 2022-12-08 NOTE — Progress Notes (Signed)
New Patient Office Visit  Subjective    Patient ID: Keith Huber, male    DOB: 1996/01/28  Age: 27 y.o. MRN: [id number]  CC:  Chief Complaint  Patient presents with   Establish Care    HPI Keith Huber presents to establish care  ADHD: was beening seen for ADHD. Currenlty on focalin xr. States that his mother in law has moved away and work has been better. States that he was seen by previous provider within 6 months.   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 States several [place] at a s child. States that stable since adult. States that he has been out of the medicaoitn for the past few months. Was followed by endocrine  for complete physical and follow up of chronic conditions.  Immunizations: -Tetanus: Completed in 2020 -Influenza: Refused -Shingles: too young -Pneumonia: too young  Covid: refused  -HPV:  Diet: Fair diet. 2 meals on the ligher side. States that he does snack. States he drinks seltzer water, juice and water.  Exercise: No regular exercise.  Eye exam: PRN  Dental exam:  needs updating   Colonoscopy: Too young, currently average risk Lung Cancer Screening: N/A Dexa: N/A  PSA: Too young, currently average risk  Sleep: states bed is 10-11 p, and will get up around 730-8. Feels rested. Does snore some.    Outpatient Encounter Medications as of 12/08/2022  Medication Sig   dexmethylphenidate (FOCALIN XR) 10 MG 24 hr capsule Take 1 capsule (10 mg total) by mouth daily.   dexmethylphenidate (FOCALIN XR) 10 MG 24 hr capsule Take 1 capsule (10 mg total) by mouth daily.   [DISCONTINUED] dexmethylphenidate (FOCALIN XR) 10 MG 24 hr capsule Take 10 mg by mouth every morning.   [DISCONTINUED] levothyroxine (SYNTHROID) 150 MCG tablet Take 150 mcg by mouth daily before breakfast.   dexmethylphenidate (FOCALIN XR) 10 MG 24 hr capsule Take 1 capsule (10 mg total) by mouth every morning.   levothyroxine (SYNTHROID) 150 MCG tablet Take 1 tablet (150 mcg total)  by mouth daily before breakfast.   [DISCONTINUED] buPROPion (WELLBUTRIN XL) 150 MG 24 hr tablet Take 150 mg by mouth every morning. (Patient not taking: Reported on 06/09/2022)   [DISCONTINUED] levothyroxine (SYNTHROID) 125 MCG tablet Take 1 tablet by mouth daily.   No facility-administered encounter medications on file as of 12/08/2022.    Past Medical History:  Diagnosis Date   ADHD    Alopecia    Autism    GERD (gastroesophageal reflux disease)    History of kidney stones    Hypothyroidism     Past Surgical History:  Procedure Laterality Date   COLONOSCOPY WITH PROPOFOL N/A 06/14/2022   Procedure: COLONOSCOPY WITH PROPOFOL;  Surgeon: Lucilla Lame, MD;  Location: [facility];  Service: Endoscopy;  Laterality: N/A;   WISDOM TOOTH EXTRACTION      Family History  Problem Relation Age of Onset   Arthritis Mother    Bipolar disorder Mother    Hypertension Father    Sleep apnea Father     Social History   Socioeconomic History   Marital status: Married    Spouse name: Kaitlyn   Number of children: 0   Years of education: Not on file   Highest education level: Associate degree: academic program  Occupational History   Not on file  Tobacco Use   Smoking status: Never   Smokeless tobacco: Never  Vaping Use   Vaping Use: Never used  Substance and Sexual Activity   Alcohol  use: Yes    Comment: occasional   Drug use: Not Currently   Sexual activity: Not on file  Other Topics Concern   Not on file  Social History Narrative   Fulltime: Engineer, mining   Expecting baby boy in Feb 2024      Hobbies: axe throwing, chilling at home, [place] the lawn   Social Determinants of Health   Financial Resource Strain: Not on file  Food Insecurity: Not on file  Transportation Needs: Not on file  Physical Activity: Not on file  Stress: Not on file  Social Connections: Not on file  Intimate Partner Violence: Not on file    Review of Systems  Constitutional:   Negative for chills and fever.  Respiratory:  Negative for shortness of breath.   Cardiovascular:  Negative for chest pain and leg swelling.  Gastrointestinal:  Negative for abdominal pain, blood in stool, constipation, diarrhea, nausea and vomiting.       BM daily   Genitourinary:  Negative for dysuria and hematuria.  Neurological:  Negative for tingling and headaches.  Psychiatric/Behavioral:  Negative for hallucinations and suicidal ideas.         Objective    BP 122/88   Pulse 86   Ht 5' 10.5" (1.791 m)   Wt 212 lb (96.2 kg)   SpO2 97%   BMI 29.99 kg/m   Physical Exam Vitals and nursing note reviewed. Exam conducted with a chaperone present Dorna Mai, CMA).  Constitutional:      Appearance: Normal appearance.  HENT:     Right Ear: Tympanic membrane, ear canal and external ear normal.     Left Ear: Tympanic membrane, ear canal and external ear normal.     Mouth/Throat:     Mouth: Mucous membranes are moist.     Pharynx: Oropharynx is clear.  Eyes:     Extraocular Movements: Extraocular movements intact.     Pupils: Pupils are equal, round, and reactive to light.  Cardiovascular:     Rate and Rhythm: Normal rate and regular rhythm.     Pulses: Normal pulses.     Heart sounds: Normal heart sounds.  Pulmonary:     Effort: Pulmonary effort is normal.     Breath sounds: Normal breath sounds.  Abdominal:     General: Bowel sounds are normal. There is no distension.     Palpations: There is no mass.     Tenderness: There is no abdominal tenderness.     Hernia: No hernia is present. There is no hernia in the left inguinal area or right inguinal area.  Genitourinary:    Penis: Normal.      Testes: Normal.     Epididymis:     Right: Normal.     Left: Normal.  Musculoskeletal:     Right lower leg: No edema.     Left lower leg: No edema.  Lymphadenopathy:     Cervical: No cervical adenopathy.     Lower Body: No right inguinal adenopathy. No left inguinal  adenopathy.  Skin:    General: Skin is warm.  Neurological:     General: No focal deficit present.     Mental Status: He is alert.     Deep Tendon Reflexes:     Reflex Scores:      Bicep reflexes are 2+ on the right side and 2+ on the left side.      Patellar reflexes are 2+ on the right side and 2+ on the left  side.    Comments: Bilateral upper and lower extremity strength 5/5  Psychiatric:        Mood and Affect: Mood normal.        Behavior: Behavior normal.        Thought Content: Thought content normal.        Judgment: Judgment normal.         Assessment & Plan:   Problem List Items Addressed This Visit       Endocrine   Acquired hypothyroidism    Patient was maintained on 150 mcg levothyroxine daily.  Has been out of medication we will refill medication.  Pending TSH today      Relevant Medications   levothyroxine (SYNTHROID) 150 MCG tablet   Other Relevant Orders   TSH     Musculoskeletal and Integument   Alopecia universalis     Other   Attention deficit hyperactivity disorder (ADHD), combined type    Patient was evaluated by psychiatry which and is now out of network.  He is on Focalin XR.  Will continue medication.  Record release signed today to verify diagnosis.  Once records gotten reviewed I will continue managing medication for patient.      Relevant Medications   dexmethylphenidate (FOCALIN XR) 10 MG 24 hr capsule   dexmethylphenidate (FOCALIN XR) 10 MG 24 hr capsule   dexmethylphenidate (FOCALIN XR) 10 MG 24 hr capsule   Other Relevant Orders   TSH   Autism spectrum disorder   Overweight    Work on healthy lifestyle modifications.  Pending A1c      Relevant Orders   Hemoglobin A1c   TSH   Lipid panel   Preventative health care - Primary    Discussed age-appropriate immunizations and screening exams.  Patient is up-to-date on Tdap.  Patient refused COVID and flu vaccine.  Patient was given information at discharge in regards to  preventative healthcare maintenance with anticipatory guidance for his age range.      Relevant Orders   CBC   Comprehensive metabolic panel   Hemoglobin A1c   TSH   Lipid panel    Return in about 3 months (around 03/09/2023) for Med recheck .   Romilda Garret, NP

## 2022-12-09 LAB — COMPREHENSIVE METABOLIC PANEL
ALT: 27 U/L (ref 0–53)
AST: 25 U/L (ref 0–37)
Albumin: 4.8 g/dL (ref 3.5–5.2)
Alkaline Phosphatase: 65 U/L (ref 39–117)
BUN: 15 mg/dL (ref 6–23)
CO2: 29 mEq/L (ref 19–32)
Calcium: 9.6 mg/dL (ref 8.4–10.5)
Chloride: 100 mEq/L (ref 96–112)
Creatinine, Ser: 1.32 mg/dL (ref 0.40–1.50)
GFR: 73.2 mL/min (ref >60.00)
Glucose, Bld: 83 mg/dL (ref 70–99)
Potassium: 3.9 mEq/L (ref 3.5–5.1)
Sodium: 137 mEq/L (ref 135–145)
Total Bilirubin: 0.3 mg/dL (ref 0.2–1.2)
Total Protein: 7.9 g/dL (ref 6.0–8.3)

## 2022-12-09 LAB — LIPID PANEL
Cholesterol: 302 mg/dL — ABNORMAL HIGH (ref 0–200)
HDL: 61.1 mg/dL (ref >39.00)
LDL Cholesterol: 220 mg/dL — ABNORMAL HIGH (ref 0–99)
NonHDL: 241.33
Total CHOL/HDL Ratio: 5
Triglycerides: 105 mg/dL (ref 0.0–149.0)
VLDL: 21 mg/dL (ref 0.0–40.0)

## 2022-12-09 LAB — CBC
HCT: 40.2 % (ref 39.0–52.0)
Hemoglobin: 13.7 g/dL (ref 13.0–17.0)
MCHC: 34.1 g/dL (ref 30.0–36.0)
MCV: 86.1 fl (ref 78.0–100.0)
Platelets: 317 10*3/uL (ref 150.0–400.0)
RBC: 4.67 Mil/uL (ref 4.22–5.81)
RDW: 15 % (ref 11.5–15.5)
WBC: 6.6 10*3/uL (ref 4.0–10.5)

## 2022-12-09 LAB — HEMOGLOBIN A1C: Hgb A1c MFr Bld: 6.3 % (ref 4.6–6.5)

## 2022-12-09 LAB — TSH: TSH: 320.74 u[IU]/mL — ABNORMAL HIGH (ref 0.35–5.50)

## 2022-12-10 ENCOUNTER — Other Ambulatory Visit: Payer: Self-pay | Admitting: Nurse Practitioner

## 2022-12-10 DIAGNOSIS — R7303 Prediabetes: Secondary | ICD-10-CM

## 2022-12-10 DIAGNOSIS — E039 Hypothyroidism, unspecified: Secondary | ICD-10-CM

## 2022-12-10 DIAGNOSIS — E7800 Pure hypercholesterolemia, unspecified: Secondary | ICD-10-CM

## 2023-02-08 IMAGING — CT CT RENAL STONE PROTOCOL
2 of 4 series · 16 of 46 positions shown, 18 images · non-contrast
Comparison: 10/22/2021.

CLINICAL DATA: Right flank pain with nausea and vomiting. History
of kidney stones.



[Series 2: ap without · axial · non-contrast · 0.78mm/px · z∈[+1090,+1530]mm · 13 of 100 slices shown, 15 images]
[im 6/100  soft-tissue]
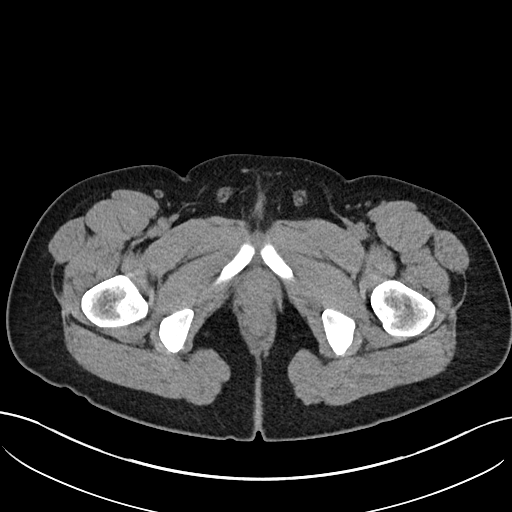
[im 6/100  bone]
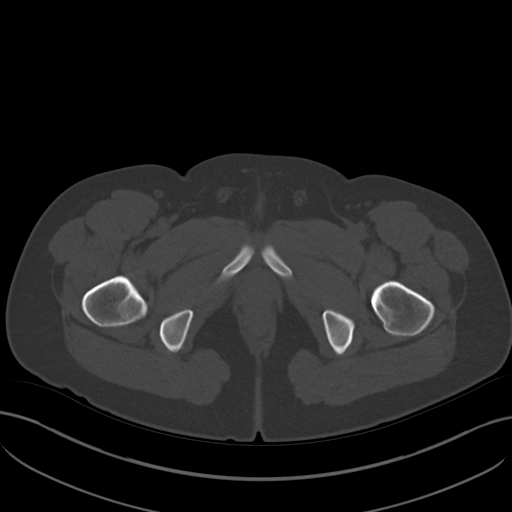
[im 12/100  soft-tissue]
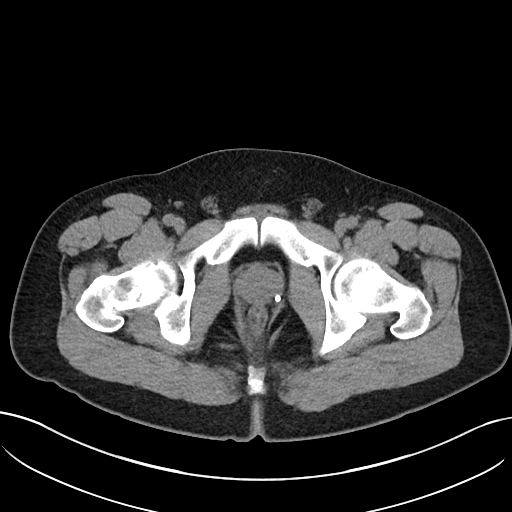
[im 24/100  soft-tissue]
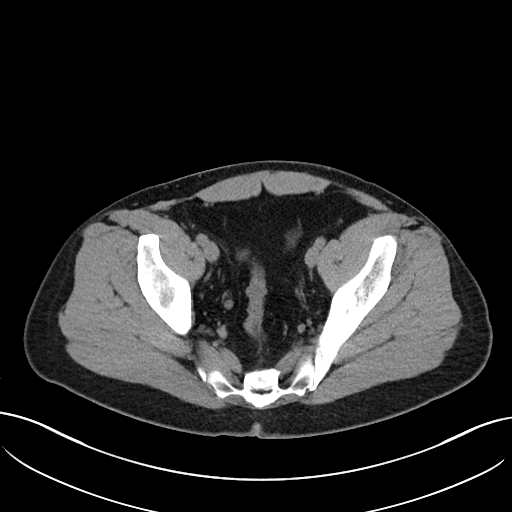
[im 30/100  soft-tissue]
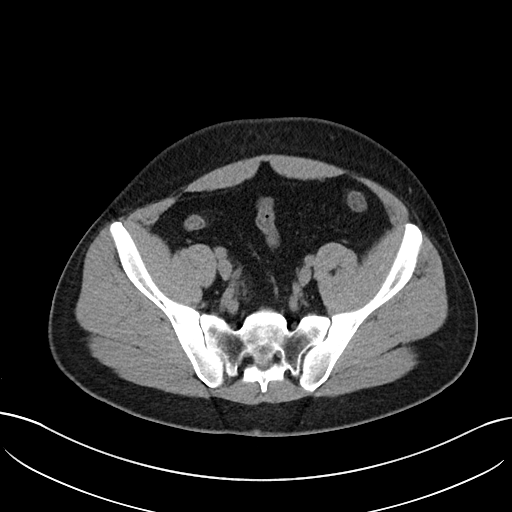
[im 35/100  soft-tissue]
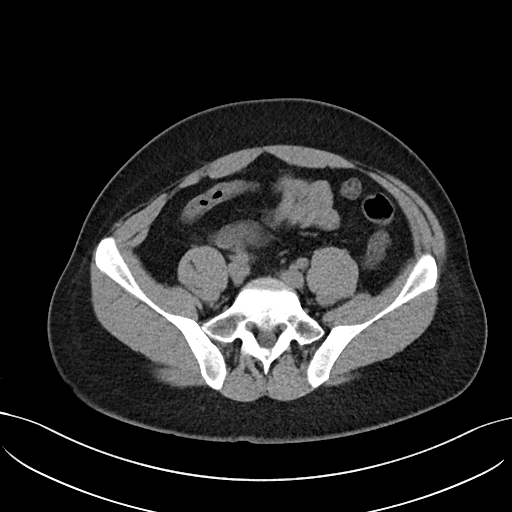
[im 41/100  soft-tissue]
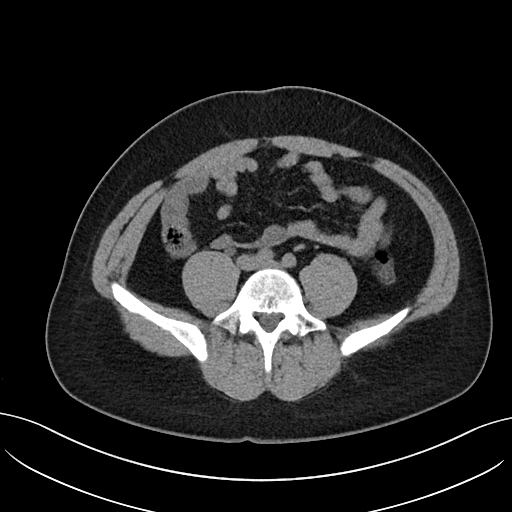
[im 53/100  soft-tissue]
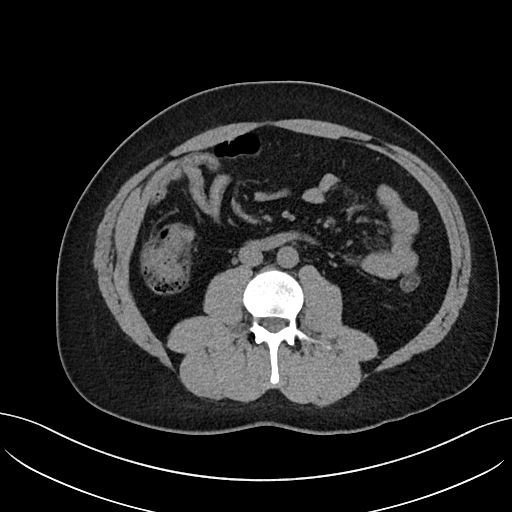
[im 59/100  soft-tissue]
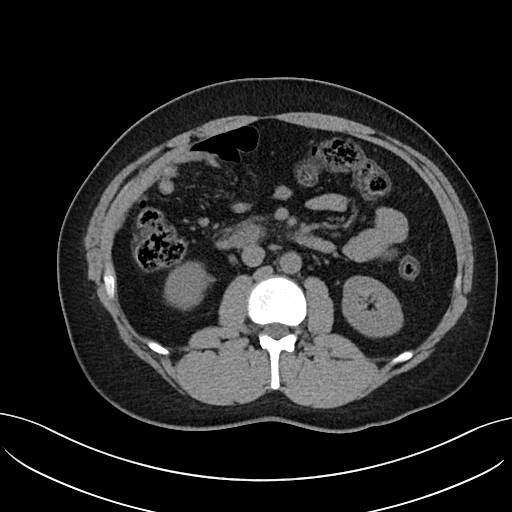
[im 65/100  soft-tissue]
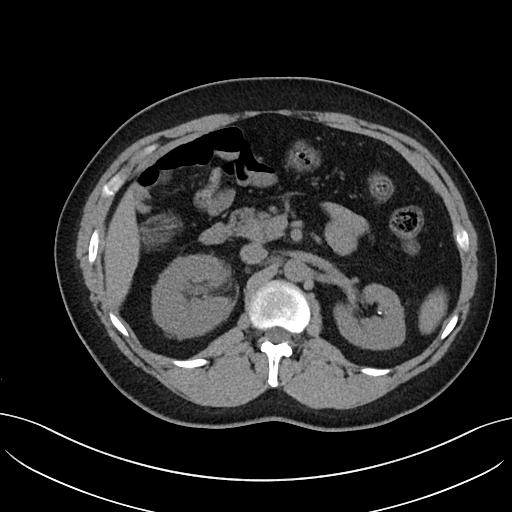
[im 65/100  bone]
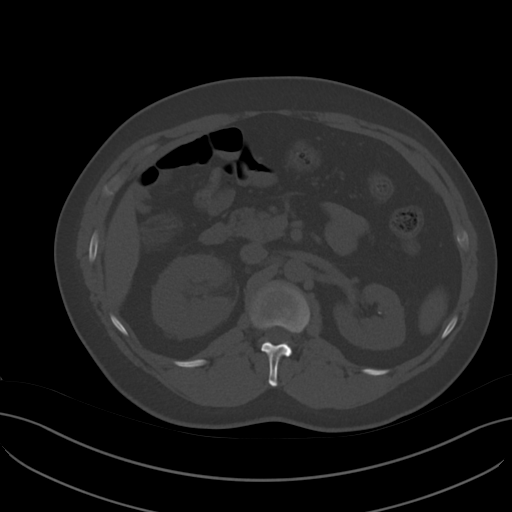
[im 70/100  soft-tissue]
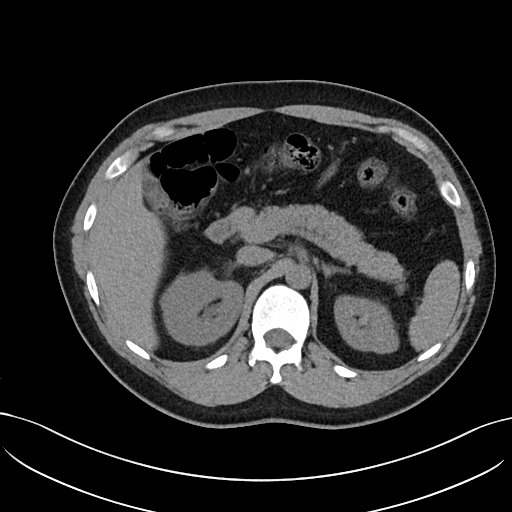
[im 76/100  soft-tissue]
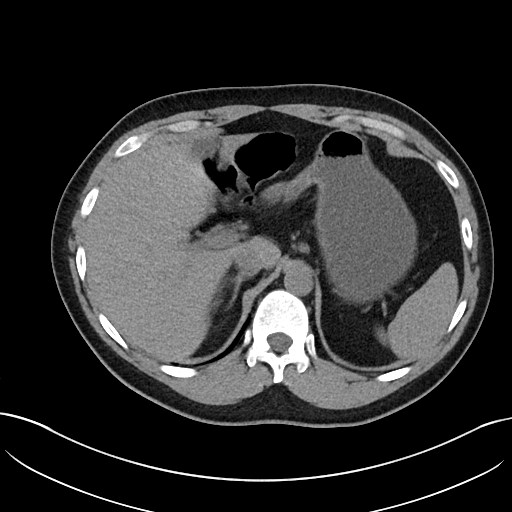
[im 88/100  soft-tissue]
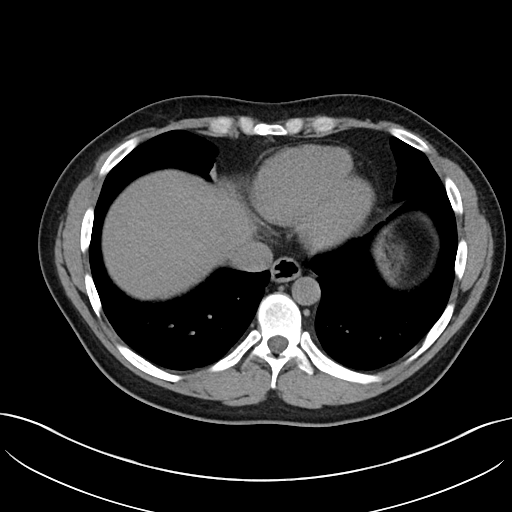
[im 94/100  soft-tissue]
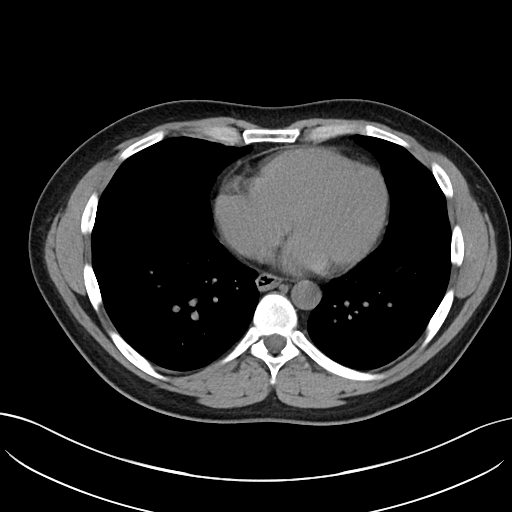

[Series 5: cor · coronal · 0.83mm/px · 3 of 98 slices shown]
[im 33/98  soft-tissue]
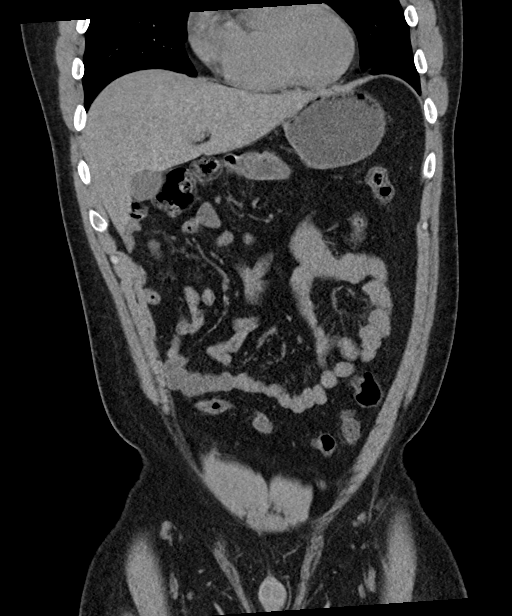
[im 44/98  soft-tissue]
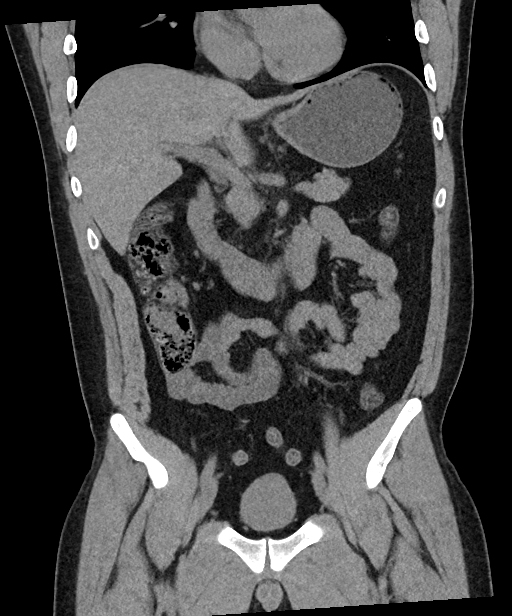
[im 54/98  soft-tissue]
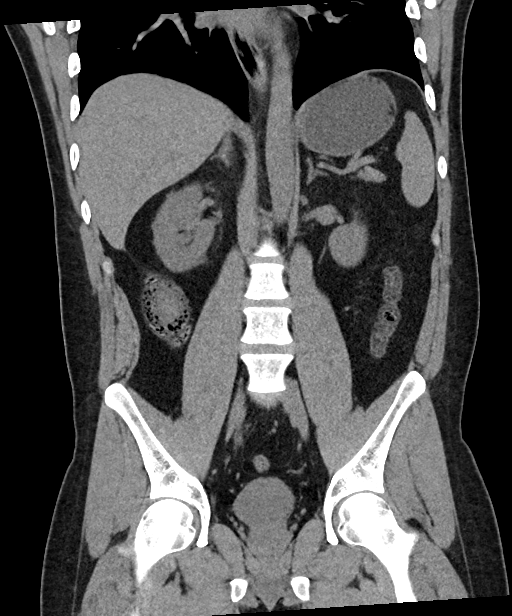

[16 of 46 positions shown; findings below may reference images not displayed]

FINDINGS: Lower chest: Scattered scarring in the lung bases. Heart size
normal. No pericardial or pleural effusion. Distal esophagus is
unremarkable.

Hepatobiliary: Liver and gallbladder are unremarkable. No biliary
ductal dilatation.

Pancreas: Negative.

Spleen: Negative.

Adrenals/Urinary Tract: Adrenal glands are unremarkable. Tiny stones
in the kidneys bilaterally. Mild right renal and right periureteric
stranding with minimal right hydronephrosis secondary to a 3 mm
stone in the distal right ureter, at the ureterovesical junction
(2/83). Kidneys are otherwise unremarkable. Left ureter is
decompressed. Bladder is low in volume.

Stomach/Bowel: Stomach, small bowel, appendix and colon are
unremarkable.

Vascular/Lymphatic: Vascular structures are unremarkable. No
pathologically enlarged lymph nodes.

Reproductive: Prostate is normal in size.

Other: No free fluid.  Mesenteries and peritoneum are unremarkable.

Musculoskeletal: Bone island in proximal right femur. Otherwise
negative.
IMPRESSION: 1. Minimally obstructing 3 mm distal right ureteral stone, at the
ureterovesical junction.
2. Bilateral renal stones.

## 2023-03-03 ENCOUNTER — Telehealth: Payer: Self-pay | Admitting: Nurse Practitioner

## 2023-03-03 NOTE — Telephone Encounter (Signed)
Patient has not scheduled a thyroid number recheck now that he is on his medication. Can  we get that scheduled please

## 2023-03-03 NOTE — Telephone Encounter (Signed)
Pt has appointment on 03/09/2023

## 2023-03-09 ENCOUNTER — Ambulatory Visit: Payer: Managed Care, Other (non HMO) | Admitting: Nurse Practitioner

## 2023-03-11 ENCOUNTER — Ambulatory Visit: Payer: Managed Care, Other (non HMO) | Admitting: Nurse Practitioner

## 2023-03-11 VITALS — BP 122/82 | HR 86 | Temp 98.9°F | Resp 16 | Ht 70.5 in | Wt 202.1 lb

## 2023-03-11 DIAGNOSIS — E039 Hypothyroidism, unspecified: Secondary | ICD-10-CM | POA: Diagnosis not present

## 2023-03-11 DIAGNOSIS — F902 Attention-deficit hyperactivity disorder, combined type: Secondary | ICD-10-CM

## 2023-03-11 DIAGNOSIS — R12 Heartburn: Secondary | ICD-10-CM

## 2023-03-11 DIAGNOSIS — L631 Alopecia universalis: Secondary | ICD-10-CM | POA: Diagnosis not present

## 2023-03-11 DIAGNOSIS — Z87442 Personal history of urinary calculi: Secondary | ICD-10-CM

## 2023-03-11 NOTE — Assessment & Plan Note (Signed)
Patient currently maintained on Focalin 10 mg XR daily.  States some days he does not take it like over the weekend when not working.  Continue medication as prescribed PDMP reviewed

## 2023-03-11 NOTE — Progress Notes (Signed)
Established Patient Office Visit  Subjective   Patient ID: Keith Huber, male    DOB: 1996/04/06  Age: 27 y.o. MRN: [id number]  Chief Complaint  Patient presents with   Medication Refill    Medication Refill Pertinent negatives include no abdominal pain, chest pain, chills, fever, headaches, nausea or vomiting.     ADHD: patient is curretly maintained on focalin 10mg  States that he works from home. 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 States that he feels like he can focus and complete the tasks needed  Hypothyroidism: currently maintianed on levothyroxine .   Review of Systems  Constitutional:  Negative for chills and fever.  Respiratory:  Negative for shortness of breath.   Cardiovascular:  Negative for chest pain.  Gastrointestinal:  Negative for abdominal pain, constipation, diarrhea, nausea and vomiting.  Neurological:  Negative for headaches.  Psychiatric/Behavioral:  Negative for hallucinations and suicidal ideas.       Objective:     BP 122/82   Pulse 86   Temp 98.9 F (37.2 C)   Resp 16   Ht 5' 10.5" (1.791 m)   Wt 202 lb 2 oz (91.7 kg)   SpO2 98%   BMI 28.59 kg/m  BP Readings from Last 3 Encounters:  03/11/23 122/82  12/08/22 122/88  06/14/22 111/70   Wt Readings from Last 3 Encounters:  03/11/23 202 lb 2 oz (91.7 kg)  12/08/22 212 lb (96.2 kg)  06/14/22 192 lb 6.4 oz (87.3 kg)      Physical Exam Vitals and nursing note reviewed.  Constitutional:      Appearance: Normal appearance.  Cardiovascular:     Rate and Rhythm: Normal rate and regular rhythm.     Heart sounds: Normal heart sounds.  Pulmonary:     Effort: Pulmonary effort is normal.     Breath sounds: Normal breath sounds.  Neurological:     Mental Status: He is alert.      No results found for any visits on 03/11/23.    The ASCVD Risk score (Arnett DK, et al., 2019) failed to calculate for the following reasons:   The 2019 ASCVD risk score is only valid for ages 11 to  59    Assessment & Plan:   Problem List Items Addressed This Visit       Endocrine   Acquired hypothyroidism - Primary    Patient currently maintained on levothyroxine 150 mcg daily.  Last time patient was seen he was out of medication TSH came back acutely had a range has been on medication for 3 months check TSH today      Relevant Orders   TSH   T4, free     Musculoskeletal and Integument   Alopecia universalis     Other   Attention deficit hyperactivity disorder (ADHD), combined type    Patient currently maintained on Focalin 10 mg XR daily.  States some days he does not take it like over the weekend when not working.  Continue medication as prescribed PDMP reviewed      History of kidney stones    History of the same.  States he had about 4 episodes most recently this past week also what he can do to avoid did discuss avoiding overt sources of calcium which patient admitted taking Tums 2-3 times a week.      Heartburn    Taking Tums 2-3 times a week for this.  Discouraged use with history of kidney stones and recent passing of 1 per  his report.  Use Pepcid 20 mg nightly as needed       Return in about 3 months (around 06/10/2023) for Fasting for labs and ADHD med recheck .    Audria Nine, NP

## 2023-03-11 NOTE — Assessment & Plan Note (Signed)
Patient currently maintained on levothyroxine 150 mcg daily.  Last time patient was seen he was out of medication TSH came back acutely had a range has been on medication for 3 months check TSH today

## 2023-03-11 NOTE — Assessment & Plan Note (Signed)
History of the same.  States he had about 4 episodes most recently this past week also what he can do to avoid did discuss avoiding overt sources of calcium which patient admitted taking Tums 2-3 times a week.

## 2023-03-11 NOTE — Patient Instructions (Addendum)
Nice to see you today I iwll be in touch with the labs once I have reviewed them Follow up with me in 3 months. That needs to be fasting to check cholesterol   Get Pepcid over the counter and take 20mg  as needed for heartburn

## 2023-03-11 NOTE — Assessment & Plan Note (Signed)
Taking Tums 2-3 times a week for this.  Discouraged use with history of kidney stones and recent passing of 1 per his report.  Use Pepcid 20 mg nightly as needed

## 2023-03-12 LAB — TSH: TSH: 6.84 mIU/L — ABNORMAL HIGH (ref 0.40–4.50)

## 2023-03-12 LAB — T4, FREE: Free T4: 1.5 ng/dL (ref 0.8–1.8)

## 2023-04-29 ENCOUNTER — Inpatient Hospital Stay
Admit: 2023-04-29 | Discharge: 2023-04-29 | Disposition: A | Discharge status: Home / Self Care | Payer: PRIVATE HEALTH INSURANCE

## 2023-04-29 ENCOUNTER — Emergency Department: Admit: 2023-04-29 | Payer: PRIVATE HEALTH INSURANCE | Primary: Adult Health

## 2023-04-29 DIAGNOSIS — N201 Calculus of ureter: Secondary | ICD-10-CM

## 2023-04-29 LAB — CBC WITH AUTO DIFFERENTIAL
Basophils %: 0 % (ref 0–2)
Basophils Absolute: 0 10*3/uL (ref 0.0–0.1)
Eosinophils %: 2 % (ref 0–5)
Eosinophils Absolute: 0.3 10*3/uL (ref 0.0–0.4)
Hematocrit: 41.9 % (ref 36.0–48.0)
Hemoglobin: 14 g/dL (ref 13.0–16.0)
Immature Granulocytes %: 0 % (ref 0.0–0.5)
Immature Granulocytes Absolute: 0 10*3/uL (ref 0.00–0.04)
Lymphocytes %: 9 % — ABNORMAL LOW (ref 21–52)
Lymphocytes Absolute: 1.1 10*3/uL (ref 0.9–3.6)
MCH: 28.3 PG (ref 24.0–34.0)
MCHC: 33.4 g/dL (ref 31.0–37.0)
MCV: 84.6 FL (ref 78.0–100.0)
MPV: 9.2 FL (ref 9.2–11.8)
Monocytes %: 6 % (ref 3–10)
Monocytes Absolute: 0.7 10*3/uL (ref 0.05–1.2)
Neutrophils %: 83 % — ABNORMAL HIGH (ref 40–73)
Neutrophils Absolute: 10.1 10*3/uL — ABNORMAL HIGH (ref 1.8–8.0)
Nucleated RBCs: 0 PER 100 WBC
Platelets: 347 10*3/uL (ref 135–420)
RBC: 4.95 M/uL (ref 4.35–5.65)
RDW: 13.3 % (ref 11.6–14.5)
WBC: 12.2 10*3/uL (ref 4.6–13.2)
nRBC: 0 10*3/uL (ref 0.00–0.01)

## 2023-04-29 LAB — COMPREHENSIVE METABOLIC PANEL
ALT: 26 U/L (ref 16–61)
AST: 14 U/L (ref 10–38)
Albumin/Globulin Ratio: 1.2 (ref 0.8–1.7)
Albumin: 4.3 g/dL (ref 3.4–5.0)
Alk Phosphatase: 77 U/L (ref 45–117)
Anion Gap: 2 mmol/L — ABNORMAL LOW (ref 3.0–18)
BUN/Creatinine Ratio: 12 (ref 12–20)
BUN: 13 MG/DL (ref 7.0–18)
CO2: 30 mmol/L (ref 21–32)
Calcium: 9.8 MG/DL (ref 8.5–10.1)
Chloride: 105 mmol/L (ref 100–111)
Creatinine: 1.07 MG/DL (ref 0.6–1.3)
Est, Glom Filt Rate: >90 mL/min/{1.73_m2} (ref >60)
Globulin: 3.7 g/dL (ref 2.0–4.0)
Glucose: 98 mg/dL (ref 74–99)
Potassium: 3.9 mmol/L (ref 3.5–5.5)
Sodium: 137 mmol/L (ref 136–145)
Total Bilirubin: 0.3 MG/DL (ref 0.2–1.0)
Total Protein: 8 g/dL (ref 6.4–8.2)

## 2023-04-29 LAB — URINALYSIS
Bilirubin, Urine: NEGATIVE
Glucose, Ur: NEGATIVE mg/dL
Ketones, Urine: NEGATIVE mg/dL
Leukocyte Esterase, Urine: NEGATIVE
Nitrite, Urine: NEGATIVE
Protein, UA: NEGATIVE mg/dL
Specific Gravity, UA: 1.013 (ref 1.005–1.030)
Urobilinogen, Urine: 0.2 EU/dL (ref 0.2–1.0)
pH, Urine: 6.5 (ref 5.0–8.0)

## 2023-04-29 LAB — URINALYSIS, MICRO
BACTERIA, URINE: NEGATIVE /hpf
RBC, UA: 21 /hpf (ref 0–5)
WBC, UA: NEGATIVE /hpf (ref 0–5)

## 2023-04-29 LAB — LIPASE: Lipase: 38 U/L (ref 13–75)

## 2023-04-29 MED ORDER — TAMSULOSIN HCL 0.4 MG PO CAPS
0.4 | ORAL_CAPSULE | Freq: Every day | ORAL | 0 refills | Status: AC
Start: 2023-04-29 — End: ?

## 2023-04-29 MED ORDER — TAMSULOSIN HCL 0.4 MG PO CAPS
0.4 | Freq: Once | ORAL | Status: AC
Start: 2023-04-29 — End: 2023-04-29
  Administered 2023-04-29: 23:00:00 0.4 mg via ORAL

## 2023-04-29 MED ORDER — LACTATED RINGERS IV BOLUS
INTRAVENOUS | Status: AC
Start: 2023-04-29 — End: 2023-04-29
  Administered 2023-04-29: 20:00:00 1000 mL via INTRAVENOUS

## 2023-04-29 MED ORDER — KETOROLAC TROMETHAMINE 10 MG PO TABS
10 | ORAL_TABLET | Freq: Four times a day (QID) | ORAL | 0 refills | Status: AC | PRN
Start: 2023-04-29 — End: ?

## 2023-04-29 MED ORDER — IOPAMIDOL 61 % IV SOLN
61 | Freq: Once | INTRAVENOUS | Status: AC | PRN
Start: 2023-04-29 — End: 2023-04-29
  Administered 2023-04-29: 22:00:00 100 mL via INTRAVENOUS

## 2023-04-29 MED ORDER — ONDANSETRON 4 MG PO TBDP
4 | ORAL_TABLET | Freq: Three times a day (TID) | ORAL | 0 refills | Status: AC | PRN
Start: 2023-04-29 — End: ?

## 2023-04-29 MED ORDER — ONDANSETRON HCL 4 MG/2ML IJ SOLN
4 | INTRAMUSCULAR | Status: DC
Start: 2023-04-29 — End: 2023-04-29

## 2023-04-29 MED ORDER — OXYCODONE-ACETAMINOPHEN 5-325 MG PO TABS
5-325 | ORAL_TABLET | Freq: Four times a day (QID) | ORAL | 0 refills | Status: AC | PRN
Start: 2023-04-29 — End: 2023-05-02

## 2023-04-29 MED ORDER — KETOROLAC TROMETHAMINE 15 MG/ML IJ SOLN
15 | INTRAMUSCULAR | Status: DC
Start: 2023-04-29 — End: 2023-04-29

## 2023-04-29 MED FILL — ISOVUE-300 61 % IV SOLN: 61 % | INTRAVENOUS | Qty: 100

## 2023-04-29 MED FILL — LACTATED RINGERS IV SOLN: INTRAVENOUS | Qty: 1000

## 2023-04-29 MED FILL — TAMSULOSIN HCL 0.4 MG PO CAPS: 0.4 MG | ORAL | Qty: 1

## 2023-04-29 NOTE — ED Notes (Signed)
Pt educated on straining urine and provided with urinal, strainer and specimen cup.

## 2023-04-29 NOTE — ED Triage Notes (Signed)
Pt ambulatory to ed with complaints of LUQ pain x5 days. Pt endorses N/V/D. Hx of kidney stones.

## 2023-04-29 NOTE — ED Provider Notes (Signed)
[facility] EMERGENCY DEPT  EMERGENCY DEPARTMENT ENCOUNTER       Pt Name: Steven Wagner  MRN: [id number]  Birthdate October 18, 1996  Date of evaluation: 04/29/2023  PCP: File, Not On, DC  Note Started: 7:00 PM 04/29/23     CHIEF COMPLAINT       Chief Complaint   Patient presents with    Abdominal Pain        HISTORY OF PRESENT ILLNESS: 1 or more elements      History From: Patient  HPI Limitations: None  Chronic Conditions: renal stones  Social Determinants affecting Dx or Tx: none      Steven Wagner is a 27 y.Wagner. male who presents to ED c/Wagner left sided abdominal pain. Associated sxs are chills, nausea, vomiting, and diarrhea. Sxs x 4-5 days. Pain started after eating pizza and chicken from [employer]. Pt is visiting area from [place] for vacation. He notes hx of renal stones. He is unsure if this pain is the same. No fever, back pain, dysuria, hematuria     Nursing Notes were all reviewed and agreed with or any disagreements were addressed in the HPI.    PAST HISTORY     Past Medical History:  No past medical history on file.    Past Surgical History:  No past surgical history on file.    Family History:  No family history on file.    Social History:  Social History     Socioeconomic History    Marital status: Married       Allergies:  Allergies   Allergen Reactions    Vyvanse [Lisdexamfetamine] Other (See Comments)     Patient states it makes him lethargic        CURRENT MEDICATIONS      Current Facility-Administered Medications   Medication Dose Route Frequency Provider Last Rate Last Admin    ondansetron (ZOFRAN) injection 4 mg  4 mg IntraVENous NOW Steven Liew O, PA-C        ketorolac (TORADOL) injection 30 mg  30 mg IntraVENous NOW Steven Amend O, PA-C        tamsulosin (FLOMAX) capsule 0.4 mg  0.4 mg Oral Once Steven Loera O, PA-C         Current Outpatient Medications   Medication Sig Dispense Refill    tamsulosin (FLOMAX) 0.4 MG capsule Take 1 capsule by mouth daily 10 capsule 0    oxyCODONE-acetaminophen (PERCOCET) 5-325 MG per  tablet Take 1 tablet by mouth every 6 hours as needed for Pain for up to 3 days. Intended supply: 3 days. Take lowest dose possible to manage pain Max Daily Amount: 4 tablets 12 tablet 0    ondansetron (ZOFRAN-ODT) 4 MG disintegrating tablet Take 1 tablet by mouth every 8 hours as needed for Nausea or Vomiting 12 tablet 0    ketorolac (TORADOL) 10 MG tablet Take 1 tablet by mouth every 6 hours as needed for Pain Take with food. Do NOT take with other NSAIDs (ibuprofen, naproxen). 20 tablet 0          PHYSICAL EXAM      Vitals:    04/29/23 1430 04/29/23 1620   BP: 129/89 125/82   Pulse: 69 66   Resp: 18 18   Temp: 97.8 F (36.6 C)    SpO2: 100% 100%   Weight: 88.5 kg (195 lb)    Height: 1.803 m (5\' 11" )      Physical Exam  Vitals and nursing note reviewed.   Constitutional:  General: He is not in acute distress.     Appearance: He is well-developed.      Comments: Caucasian male in NAD. Alert. Appears comfortable. Wife at bedside.    HENT:      Head: Normocephalic and atraumatic.      Right Ear: External ear normal.      Left Ear: External ear normal.      Nose: Nose normal.   Eyes:      Conjunctiva/sclera: Conjunctivae normal.   Cardiovascular:      Rate and Rhythm: Normal rate and regular rhythm.      Heart sounds: Normal heart sounds. No murmur heard.     No friction rub. No gallop.   Pulmonary:      Effort: Pulmonary effort is normal. No respiratory distress.      Breath sounds: Normal breath sounds. No stridor. No wheezing or rhonchi.   Abdominal:      Palpations: Abdomen is soft.      Tenderness: There is abdominal tenderness in the left upper quadrant and left lower quadrant. There is no right CVA tenderness, left CVA tenderness, guarding or rebound. Negative signs include McBurney's sign.   Musculoskeletal:         General: Normal range of motion.      Cervical back: Normal range of motion.   Neurological:      Mental Status: He is alert and oriented to person, place, and time.   Psychiatric:          Behavior: Behavior normal.              DIAGNOSTIC RESULTS   LABS:    Recent Results (from the past 24 hour(s))   Urinalysis    Collection Time: 04/29/23  4:10 PM   Result Value Ref Range    Color, UA YELLOW      Appearance CLEAR      Specific Gravity, UA 1.013 1.005 - 1.030      pH, Urine 6.5 5.0 - 8.0      Protein, UA Negative NEG mg/dL    Glucose, Ur Negative NEG mg/dL    Ketones, Urine Negative NEG mg/dL    Bilirubin, Urine Negative NEG      Blood, Urine LARGE (A) NEG      Urobilinogen, Urine 0.2 0.2 - 1.0 EU/dL    Nitrite, Urine Negative NEG      Leukocyte Esterase, Urine Negative NEG     Urinalysis, Micro    Collection Time: 04/29/23  4:10 PM   Result Value Ref Range    WBC, UA Negative 0 - 5 /hpf    RBC, UA 21 to 35 0 - 5 /hpf    Epithelial Cells, UA FEW 0 - 5 /lpf    BACTERIA, URINE Negative NEG /hpf    Mucus, UA FEW (A) NEG /lpf   CBC with Auto Differential    Collection Time: 04/29/23  4:14 PM   Result Value Ref Range    WBC 12.2 4.6 - 13.2 K/uL    RBC 4.95 4.35 - 5.65 M/uL    Hemoglobin 14.0 13.0 - 16.0 g/dL    Hematocrit 16.1 09.6 - 48.0 %    MCV 84.6 78.0 - 100.0 FL    MCH 28.3 24.0 - 34.0 PG    MCHC 33.4 31.0 - 37.0 g/dL    RDW 04.5 40.9 - 81.1 %    Platelets 347 135 - 420 K/uL    MPV 9.2 9.2 - 11.8 FL  Nucleated RBCs 0.0 0 PER 100 WBC    nRBC 0.00 0.00 - 0.01 K/uL    Neutrophils % 83 (H) 40 - 73 %    Lymphocytes % 9 (L) 21 - 52 %    Monocytes % 6 3 - 10 %    Eosinophils % 2 0 - 5 %    Basophils % 0 0 - 2 %    Immature Granulocytes % 0 0.0 - 0.5 %    Neutrophils Absolute 10.1 (H) 1.8 - 8.0 K/UL    Lymphocytes Absolute 1.1 0.9 - 3.6 K/UL    Monocytes Absolute 0.7 0.05 - 1.2 K/UL    Eosinophils Absolute 0.3 0.0 - 0.4 K/UL    Basophils Absolute 0.0 0.0 - 0.1 K/UL    Immature Granulocytes Absolute 0.0 0.00 - 0.04 K/UL    Differential Type AUTOMATED     Comprehensive Metabolic Panel    Collection Time: 04/29/23  4:14 PM   Result Value Ref Range    Sodium 137 136 - 145 mmol/L    Potassium 3.9 3.5 - 5.5  mmol/L    Chloride 105 100 - 111 mmol/L    CO2 30 21 - 32 mmol/L    Anion Gap 2 (L) 3.0 - 18 mmol/L    Glucose 98 74 - 99 mg/dL    BUN 13 7.0 - 18 MG/DL    Creatinine 1.61 0.6 - 1.3 MG/DL    BUN/Creatinine Ratio 12 12 - 20      Est, Glom Filt Rate >90 >60 ml/min/1.60m2    Calcium 9.8 8.5 - 10.1 MG/DL    Total Bilirubin 0.3 0.2 - 1.0 MG/DL    ALT 26 16 - 61 U/L    AST 14 10 - 38 U/L    Alk Phosphatase 77 45 - 117 U/L    Total Protein 8.0 6.4 - 8.2 g/dL    Albumin 4.3 3.4 - 5.0 g/dL    Globulin 3.7 2.0 - 4.0 g/dL    Albumin/Globulin Ratio 1.2 0.8 - 1.7     Lipase    Collection Time: 04/29/23  4:14 PM   Result Value Ref Range    Lipase 38 13 - 75 U/L       Labs Reviewed   URINALYSIS - Abnormal; Notable for the following components:       Result Value    Blood, Urine LARGE (*)     All other components within normal limits   CBC WITH AUTO DIFFERENTIAL - Abnormal; Notable for the following components:    Neutrophils % 83 (*)     Lymphocytes % 9 (*)     Neutrophils Absolute 10.1 (*)     All other components within normal limits   COMPREHENSIVE METABOLIC PANEL - Abnormal; Notable for the following components:    Anion Gap 2 (*)     All other components within normal limits   URINALYSIS, MICRO - Abnormal; Notable for the following components:    Mucus, UA FEW (*)     All other components within normal limits   LIPASE         EKG: When ordered, EKG's are interpreted by the Emergency Department Provider in the absence of a cardiologist.  Please see their note for interpretation of EKG.     Read by me.      RADIOLOGY:  Non-plain film images such as CT, Ultrasound and MRI are read by the radiologist. Plain radiographic images are visualized and preliminarily interpreted by the ED  Provider with the below findings:       Read by me, pending review by radiologist.     Interpretation per the Radiologist below, if available at the time of this note:  CT ABDOMEN PELVIS W IV CONTRAST Additional Contrast? None   Final Result   3 mm  nonobstructing calculus in the left proximal ureter. No hydronephrosis      Electronically signed by Yevette Edwards              PROCEDURES   Unless otherwise noted below, none  Procedures         CRITICAL CARE TIME       EMERGENCY DEPARTMENT COURSE and DIFFERENTIAL DIAGNOSIS/MDM   Vitals:    Vitals:    04/29/23 1430 04/29/23 1620   BP: 129/89 125/82   Pulse: 69 66   Resp: 18 18   Temp: 97.8 F (36.6 C)    SpO2: 100% 100%   Weight: 88.5 kg (195 lb)    Height: 1.803 m (5\' 11" )        Patient was given the following medications:  Medications   ondansetron (ZOFRAN) injection 4 mg (has no administration in time range)   ketorolac (TORADOL) injection 30 mg (has no administration in time range)   tamsulosin (FLOMAX) capsule 0.4 mg (has no administration in time range)   lactated ringers bolus 1,000 mL (1,000 mLs IntraVENous New Bag 04/29/23 1620)   iopamidol (ISOVUE-300) 61 % injection 100 mL (100 mLs IntraVENous Given 04/29/23 1757)           Records Reviewed (source and summary): Nursing notes.    CLINICAL MANAGEMENT TOOLS:  Not Applicable      ED COURSE       Medial Decision Making:  DDX: viral, food borne, gastroenteritis, pancreatitis, hepatitis, GB, gastritis, PUD, appy, stone, UTI. Doubt SBO or dissection. Doubt cardiac    CT with evidence of 3 mm left proximal ureteral stone. Pain improved. Pt appears comfortable. UA without evidence of UTI. Labs reassuring. Will tx with pain control, antiemetics, Flomax. Urology FU. Reasons to RTED discussed with pt. All questions answered. Pt feels comfortable going home at this time. Pt expressed understanding and he agrees with plan.        FINAL IMPRESSION     1. Left ureteral stone            DISPOSITION/PLAN   DISPOSITION Discharge - Pending Orders Complete 04/29/2023 06:41:01 PM           PATIENT REFERRED TO:  Esperanza Sheets, MD  [address]  [address] [place] [address]  [phone]      may follow up with [facility] urology or your urologist.    [facility] EMERGENCY  DEPT  [address]  [place] [address]  [phone]    As needed, If symptoms worsen         DISCHARGE MEDICATIONS:     Medication List        START taking these medications      ketorolac 10 MG tablet  Commonly known as: TORADOL  Take 1 tablet by mouth every 6 hours as needed for Pain Take with food. Do NOT take with other NSAIDs (ibuprofen, naproxen).     ondansetron 4 MG disintegrating tablet  Commonly known as: ZOFRAN-ODT  Take 1 tablet by mouth every 8 hours as needed for Nausea or Vomiting     oxyCODONE-acetaminophen 5-325 MG per tablet  Commonly known as: Percocet  Take 1 tablet by mouth every 6 hours as needed for Pain for up to 3 days. Intended supply: 3 days. Take lowest dose possible to manage pain Max Daily Amount: 4 tablets     tamsulosin 0.4 MG capsule  Commonly known as: FLOMAX  Take 1 capsule by mouth daily               Where to Get Your Medications        These medications [facility] sent to [facility] [address], [place] - [address] - P [phone] - F [phone]  [address] [place] [address]      Phone: [phone]   ketorolac 10 MG tablet  ondansetron 4 MG disintegrating tablet  oxyCODONE-acetaminophen 5-325 MG per tablet  tamsulosin 0.4 MG capsule                  I am the Primary Clinician of Record.       (Please note that parts of this dictation were completed with voice recognition software. Quite often unanticipated grammatical, syntax, homophones, and other interpretive errors are inadvertently transcribed by the computer software. Please disregards these errors. Please excuse any errors that have escaped final proofreading.)       Weston Settle, PA-C  04/29/23 1900

## 2023-05-02 ENCOUNTER — Telehealth: Payer: Self-pay

## 2023-05-02 NOTE — Transitions of Care (Post Inpatient/ED Visit) (Signed)
   05/02/2023  Name: Keith Huber MRN: [id number] DOB: August 20, 1996  Today's TOC FU Call Status: Today's TOC FU Call Status:: Unsuccessul Call (1st Attempt) Unsuccessful Call (1st Attempt) Date: 05/02/23  Attempted to reach the patient regarding the most recent Inpatient/ED visit.  Follow Up Plan: Additional outreach attempts will be made to reach the patient to complete the Transitions of Care (Post Inpatient/ED visit) call.   Signature Elisha Ponder LPN [facility] AWV Team Direct dial:  [phone]

## 2023-05-04 NOTE — Transitions of Care (Post Inpatient/ED Visit) (Signed)
Spoke with pt; pt seen 04/29/23 at [facility] ED in [place] on 04/29/23.pt dx with calculus in kidney. Pt has been straining urine but has not seen kidney stone yet. Pt said he has not had pain for few days. Pt is taking tamsulosin 0.4 mg daily; pt has not been nauseated so pt has not taken ondansetron, no pain in few days and pt not taking toradol 10 mg or oxycodone apap 5-325 mg. Pt has not seen urologist lately and wants referral from [facility] if urologist is necessary.  UC & ED precautions given and pt voiced understanding.sending note to Audria Nine NP offered pt sooner appt but pt declined due to work schedule. Pt scheduled appt with Audria Nine NP on 05/06/23 at 10:40.      05/04/2023  Name: Keith Huber MRN: [id number] DOB: [birth year]  Today's TOC FU Call Status: Today's TOC FU Call Status:: Successful TOC FU Call Competed Unsuccessful Call (1st Attempt) Date: 05/04/23 [facility] FU Call Complete Date: 05/04/23  Transition Care Management Follow-up Telephone Call Date of Discharge: 04/29/23 Discharge Facility: Other Recruitment consultant Facility) Name of Other (Non-[facility]) Discharge Facility: [facility] ED in [place] Type of Discharge: Emergency Department Reason for ED Visit: Renal (kidney stone) How have you been since you were released from the hospital?: Better Any questions or concerns?: No  Items Reviewed: Did you receive and understand the discharge instructions provided?: Yes (pt is straining urine but has not seen kidney stone yet.) Medications obtained,verified, and reconciled?: Yes (Medications Reviewed) (pt is aware how to take all meds prescribedf to him. ED prescribed Tamsulosin 0.4 mg one daily has not needfed ondansetron due to no nausea. Toradol 10 mg and oxycodone apap 5-325 mg has not taken either pain med in days.) Any new allergies since your discharge?: No Dietary orders reviewed?: NA Do you have support at home?: Yes People in Home:  spouse Name of Support/Comfort Primary Source: Kaitlyn  Medications Reviewed Today: Medications Reviewed Today     Reviewed by Vertis Kelch, CMA (Certified Medical Assistant) on 03/11/23 at 1551  Med List Status: <None>   Medication Order Taking? Sig Documenting Provider Last Dose Status Informant  dexmethylphenidate (FOCALIN XR) 10 MG 24 hr capsule [id number] Yes Take 1 capsule (10 mg total) by mouth every morning. Eden Emms, NP Taking Active   dexmethylphenidate (FOCALIN XR) 10 MG 24 hr capsule [id number]  Take 1 capsule (10 mg total) by mouth daily. Eden Emms, NP  Active   dexmethylphenidate (FOCALIN XR) 10 MG 24 hr capsule [id number]  Take 1 capsule (10 mg total) by mouth daily. Eden Emms, NP  Active   levothyroxine (SYNTHROID) 150 MCG tablet [id number] Yes Take 1 tablet (150 mcg total) by mouth daily before breakfast. Eden Emms, NP Taking Active             Home Care and Equipment/Supplies: Were Home Health Services Ordered?: NA Any new equipment or medical supplies ordered?: NA  Functional Questionnaire: Do you need assistance with bathing/showering or dressing?: No Do you need assistance with meal preparation?: No Do you need assistance with eating?: No Do you have difficulty maintaining continence: No Do you need assistance with getting out of bed/getting out of a chair/moving?: No Do you have difficulty managing or taking your medications?: No  Follow up appointments reviewed: PCP Follow-up appointment confirmed?: Yes Date of PCP follow-up appointment?: 05/06/23 (offered sooner appt but did not work for pts work schedule.) Follow-up Provider: Genene Churn  Cable NP [facility] Follow-up appointment confirmed?: NA (pt does not have urologist and wants Matt to do referral if needed,.) Do you need transportation to your follow-up appointment?: No Do you understand care options if your condition(s) worsen?: Yes-patient verbalized  understanding    SIGNATURE Lewanda Rife, LPN

## 2023-05-04 NOTE — Telephone Encounter (Signed)
noted 

## 2023-05-06 ENCOUNTER — Encounter: Payer: Self-pay | Admitting: Nurse Practitioner

## 2023-05-06 ENCOUNTER — Ambulatory Visit: Payer: Managed Care, Other (non HMO) | Admitting: Nurse Practitioner

## 2023-05-06 VITALS — BP 120/76 | HR 85 | Temp 98.1°F | Resp 16 | Ht 70.5 in | Wt 197.0 lb

## 2023-05-06 DIAGNOSIS — Z09 Encounter for follow-up examination after completed treatment for conditions other than malignant neoplasm: Secondary | ICD-10-CM | POA: Insufficient documentation

## 2023-05-06 NOTE — Assessment & Plan Note (Signed)
Patient was seen at a hospital in [place] and diagnosed with nephrolithiasis.  History of the same.  Patient's not having any trouble or pain currently still has pain medication at home currently on tamsulosin.  Patient states he does not think he has passed it yet patient was given a specimen cup along with Urine strainer to try to capture stone we will send off for stone analysis if able to capture.

## 2023-05-06 NOTE — Patient Instructions (Signed)
Nice to see you today I want to see you in approx 1 month for your medication recheck, sooner if needed

## 2023-05-06 NOTE — Progress Notes (Signed)
Acute Office Visit  Subjective:     Patient ID: Keith Huber, male    DOB: 1996-06-24, 27 y.o.   MRN: [id number]  Chief Complaint  Patient presents with   Follow-up    Kidney stone    HPI Patient is in today for hoppital follow up   Patient was seen at [facility] on 04/29/2023.  He presented with left-sided abdominal pain with chills, nausea, vomiting and diarrhea for about 4 to 5 days patient does have a history of renal stones.  Patient underwent a CT abdomen pelvis with contrast showed a 3 mm nonobstructing calculus in the left proximal ureter no hydronephrosis.  Gave patient pain medication antiemetics tamsulosin and urology follow-up.  Patient is here for outpatient follow-up with primary care today.  States that he has been straining urine. States that he has not having any abdominal pain. No blood in urine. Patient states he had urology in the past just with infertility consultation but does not follow with them regularly.  States he does get kidney stones approximately 6 months.  Will attempt to catch stone for stone analysis and refer to urology if needed.  Review of Systems  Constitutional:  Negative for chills and fever.  Respiratory:  Negative for shortness of breath.   Cardiovascular:  Negative for chest pain.  Gastrointestinal:  Negative for abdominal pain, constipation, diarrhea, nausea and vomiting.  Genitourinary:  Negative for dysuria and hematuria.        Objective:    BP 120/76   Pulse 85   Temp 98.1 F (36.7 C)   Resp 16   Ht 5' 10.5" (1.791 m)   Wt 197 lb (89.4 kg)   SpO2 98%   BMI 27.87 kg/m  BP Readings from Last 3 Encounters:  05/06/23 120/76  03/11/23 122/82  12/08/22 122/88   Wt Readings from Last 3 Encounters:  05/06/23 197 lb (89.4 kg)  03/11/23 202 lb 2 oz (91.7 kg)  12/08/22 212 lb (96.2 kg)      Physical Exam Vitals and nursing note reviewed.  Constitutional:      Appearance: Normal appearance.   Cardiovascular:     Rate and Rhythm: Normal rate and regular rhythm.     Heart sounds: Normal heart sounds.  Pulmonary:     Effort: Pulmonary effort is normal.     Breath sounds: Normal breath sounds.  Abdominal:     General: Bowel sounds are normal. There is no distension.     Palpations: There is no mass.     Tenderness: There is no abdominal tenderness. There is no right CVA tenderness or left CVA tenderness.     Hernia: No hernia is present.  Neurological:     Mental Status: He is alert.     No results found for any visits on 05/06/23.      Assessment & Plan:   Problem List Items Addressed This Visit       Other   Hospital discharge follow-up - Primary    Patient was seen at a hospital in [place] and diagnosed with nephrolithiasis.  History of the same.  Patient's not having any trouble or pain currently still has pain medication at home currently on tamsulosin.  Patient states he does not think he has passed it yet patient was given a specimen cup along with Urine strainer to try to capture stone we will send off for stone analysis if able to capture.       No orders of  the defined types were placed in this encounter.   Return in about 4 weeks (around 06/03/2023) for ADHD medication recheck, lipid/A1C.  Audria Nine, NP

## 2023-06-06 ENCOUNTER — Ambulatory Visit: Payer: Managed Care, Other (non HMO) | Admitting: Nurse Practitioner

## 2023-06-15 ENCOUNTER — Ambulatory Visit: Payer: Managed Care, Other (non HMO) | Admitting: Nurse Practitioner

## 2023-06-27 ENCOUNTER — Encounter: Payer: Self-pay | Admitting: Nurse Practitioner

## 2023-06-27 ENCOUNTER — Ambulatory Visit: Payer: Managed Care, Other (non HMO) | Admitting: Nurse Practitioner

## 2023-06-27 VITALS — BP 118/88 | HR 100 | Temp 97.6°F | Ht 70.5 in | Wt 190.8 lb

## 2023-06-27 DIAGNOSIS — F902 Attention-deficit hyperactivity disorder, combined type: Secondary | ICD-10-CM

## 2023-06-27 DIAGNOSIS — E785 Hyperlipidemia, unspecified: Secondary | ICD-10-CM

## 2023-06-27 DIAGNOSIS — Z87442 Personal history of urinary calculi: Secondary | ICD-10-CM

## 2023-06-27 DIAGNOSIS — R7303 Prediabetes: Secondary | ICD-10-CM

## 2023-06-27 DIAGNOSIS — E039 Hypothyroidism, unspecified: Secondary | ICD-10-CM

## 2023-06-27 LAB — POCT GLYCOSYLATED HEMOGLOBIN (HGB A1C): Hemoglobin A1C: 5.7 % — AB (ref 4.0–5.6)

## 2023-06-27 LAB — TSH: TSH: 98.55 u[IU]/mL — ABNORMAL HIGH (ref 0.35–5.50)

## 2023-06-27 LAB — LIPID PANEL
Cholesterol: 213 mg/dL — ABNORMAL HIGH (ref 0–200)
HDL: 46.8 mg/dL (ref >39.00)
LDL Cholesterol: 151 mg/dL — ABNORMAL HIGH (ref 0–99)
NonHDL: 166.29
Total CHOL/HDL Ratio: 5
Triglycerides: 74 mg/dL (ref 0.0–149.0)
VLDL: 14.8 mg/dL (ref 0.0–40.0)

## 2023-06-27 NOTE — Progress Notes (Signed)
Established Patient Office Visit  Subjective   Patient ID: Keith Huber, male    DOB: 1996/05/10  Age: 27 y.o. MRN: [id number]  Chief Complaint  Patient presents with   ADHD   lab recheck    TSH, A1C, Lipid      ADD/ADHD: patient is currently on focalin xr 10 mg daily. [place] has not had the medication filled in several momths according to the PDMP.  States that he will take it as needed. States that he is taking it once or twice a week. States generally on work days. States that he is doing more projects for work and will need it    HLD: patient last cholesterol was an LDL of 220. Patient has losst approx 22 pounds since the beginning of the year. States that he is walking 6-7K steps a day with his new job. He is doing IF. States that he did join the gym and they have trying   Pre-diabetes: last A1C check was 6.3 it is 5.7 today     Review of Systems  Constitutional:  Negative for chills and fever.  Respiratory:  Negative for shortness of breath.   Cardiovascular:  Negative for chest pain.  Neurological:  Negative for headaches.  Psychiatric/Behavioral:  Negative for hallucinations and suicidal ideas. The patient does not have insomnia.       Objective:     BP 118/88   Pulse 100   Temp 97.6 F (36.4 C) (Temporal)   Ht 5' 10.5" (1.791 m)   Wt 190 lb 12.8 oz (86.5 kg)   SpO2 95%   BMI 26.99 kg/m    Physical Exam Vitals and nursing note reviewed.  Constitutional:      Appearance: Normal appearance.  Cardiovascular:     Rate and Rhythm: Normal rate and regular rhythm.     Heart sounds: Normal heart sounds.  Pulmonary:     Effort: Pulmonary effort is normal.     Breath sounds: Normal breath sounds.  Neurological:     Mental Status: He is alert.      No results found for any visits on 06/27/23.    The ASCVD Risk score (Arnett DK, et al., 2019) failed to calculate for the following reasons:   The 2019 ASCVD risk score is only valid for ages 58 to 3     Assessment & Plan:   Problem List Items Addressed This Visit       Endocrine   Acquired hypothyroidism    Patient's original TSH was over 300 last check was close to 7 we will recheck today pending TSH continue levothyroxine as prescribed      Relevant Orders   TSH     Other   Attention deficit hyperactivity disorder (ADHD), combined type    Patient currently on Focalin 10 daily.  Patient should medication as needed.  States when he has large projects or specialized training he will take the medication more frequently.  Continue taking the medication as needed no more than 1 day      History of kidney stones    History of the same patient brought in For kidney stone for stone analysis.  Sent to lab      Relevant Orders   Stone analysis   Hyperlipidemia    Previous LDL above 200 pending lipid panel today.  Patient has made lifestyle modifications continue      Relevant Orders   Lipid panel   Prediabetes - Primary    Patient's previous  A1c of 6.3%.  Today A1c of 5.7%.  Patient has changed his lifestyle inclusive of eating and  exercise.  Continue making lifestyle modifications      Relevant Orders   POCT glycosylated hemoglobin (Hb A1C)    Return in about 6 months (around 12/28/2023) for CPE and Labs.    Audria Nine, NP

## 2023-06-27 NOTE — Assessment & Plan Note (Signed)
History of the same patient brought in For kidney stone for stone analysis.  Sent to lab

## 2023-06-27 NOTE — Assessment & Plan Note (Signed)
Patient's original TSH was over 300 last check was close to 7 we will recheck today pending TSH continue levothyroxine as prescribed

## 2023-06-27 NOTE — Patient Instructions (Signed)
Nice to see you today  I will be in touch with the labs once I have reviewed them Follow up with me in 6 months for your physical and labs

## 2023-06-27 NOTE — Assessment & Plan Note (Signed)
Patient's previous A1c of 6.3%.  Today A1c of 5.7%.  Patient has changed his lifestyle inclusive of eating and  exercise.  Continue making lifestyle modifications

## 2023-06-27 NOTE — Assessment & Plan Note (Signed)
Previous LDL above 200 pending lipid panel today.  Patient has made lifestyle modifications continue

## 2023-06-27 NOTE — Assessment & Plan Note (Signed)
Patient currently on Focalin 10 daily.  Patient should medication as needed.  States when he has large projects or specialized training he will take the medication more frequently.  Continue taking the medication as needed no more than 1 day

## 2023-06-29 ENCOUNTER — Other Ambulatory Visit: Payer: Self-pay | Admitting: Nurse Practitioner

## 2023-06-29 DIAGNOSIS — E039 Hypothyroidism, unspecified: Secondary | ICD-10-CM

## 2023-08-08 ENCOUNTER — Other Ambulatory Visit (INDEPENDENT_AMBULATORY_CARE_PROVIDER_SITE_OTHER): Payer: Managed Care, Other (non HMO)

## 2023-08-08 DIAGNOSIS — E039 Hypothyroidism, unspecified: Secondary | ICD-10-CM | POA: Diagnosis not present

## 2023-08-09 LAB — TSH: TSH: 2.72 u[IU]/mL (ref 0.35–5.50)

## 2023-09-14 ENCOUNTER — Telehealth: Payer: Self-pay | Admitting: Nurse Practitioner

## 2023-09-14 DIAGNOSIS — E039 Hypothyroidism, unspecified: Secondary | ICD-10-CM

## 2023-09-14 MED ORDER — LEVOTHYROXINE SODIUM 150 MCG PO TABS
150.0000 ug | ORAL_TABLET | Freq: Every day | ORAL | 1 refills | Status: DC
Start: 2023-09-14 — End: 2024-03-14

## 2023-09-14 NOTE — Telephone Encounter (Signed)
Refill provided

## 2023-09-14 NOTE — Telephone Encounter (Signed)
LAST APPOINTMENT DATE: 06/27/2023   NEXT APPOINTMENT DATE: 12/29/2023  Levothyroxine 150 mcg  LAST REFILL: 12/08/22  QTY: #16 1WR

## 2023-09-14 NOTE — Telephone Encounter (Signed)
Prescription Request  09/14/2023  LOV: 06/27/2023  What is the name of the medication or equipment? levothyroxine (SYNTHROID) 150 MCG tablet   Have you contacted your pharmacy to request a refill? Yes   Which pharmacy would you like this sent to?   [facility] [address] [place] - [address] [place] [address] Phone: [phone] Fax: [phone]    Patient notified that their request is being sent to the clinical staff for review and that they should receive a response within 2 business days.   Please advise at [facility] [phone]

## 2023-11-11 ENCOUNTER — Other Ambulatory Visit: Payer: Self-pay

## 2023-11-11 ENCOUNTER — Emergency Department
Admission: EM | Admit: 2023-11-11 | Discharge: 2023-11-11 | Discharge status: Against Medical Advice | Payer: Managed Care, Other (non HMO) | Attending: Emergency Medicine | Admitting: Emergency Medicine

## 2023-11-11 DIAGNOSIS — Z5321 Procedure and treatment not carried out due to patient leaving prior to being seen by health care provider: Secondary | ICD-10-CM | POA: Diagnosis not present

## 2023-11-11 DIAGNOSIS — S6992XA Unspecified injury of left wrist, hand and finger(s), initial encounter: Secondary | ICD-10-CM | POA: Diagnosis present

## 2023-11-11 DIAGNOSIS — X58XXXA Exposure to other specified factors, initial encounter: Secondary | ICD-10-CM | POA: Insufficient documentation

## 2023-11-11 DIAGNOSIS — S61213A Laceration without foreign body of left middle finger without damage to nail, initial encounter: Secondary | ICD-10-CM | POA: Diagnosis not present

## 2023-11-11 NOTE — ED Triage Notes (Signed)
Pt reports injuring finger on jigsaw tonight, pt is up to date on tetanus. Pt has laceration to left middle finger, bleeding controlled.

## 2023-12-29 ENCOUNTER — Encounter: Payer: Managed Care, Other (non HMO) | Admitting: Nurse Practitioner

## 2024-03-05 ENCOUNTER — Encounter: Payer: Managed Care, Other (non HMO) | Admitting: Nurse Practitioner

## 2024-03-14 ENCOUNTER — Other Ambulatory Visit: Payer: Self-pay | Admitting: Nurse Practitioner

## 2024-03-14 DIAGNOSIS — E039 Hypothyroidism, unspecified: Secondary | ICD-10-CM

## 2024-05-28 ENCOUNTER — Encounter: Payer: Self-pay | Admitting: Nurse Practitioner

## 2024-05-28 ENCOUNTER — Ambulatory Visit (INDEPENDENT_AMBULATORY_CARE_PROVIDER_SITE_OTHER): Admitting: Nurse Practitioner

## 2024-05-28 VITALS — BP 124/88 | HR 76 | Temp 98.4°F | Ht 71.0 in | Wt 189.8 lb

## 2024-05-28 DIAGNOSIS — Z0000 Encounter for general adult medical examination without abnormal findings: Secondary | ICD-10-CM

## 2024-05-28 DIAGNOSIS — E785 Hyperlipidemia, unspecified: Secondary | ICD-10-CM | POA: Diagnosis not present

## 2024-05-28 DIAGNOSIS — R7303 Prediabetes: Secondary | ICD-10-CM | POA: Diagnosis not present

## 2024-05-28 DIAGNOSIS — F902 Attention-deficit hyperactivity disorder, combined type: Secondary | ICD-10-CM

## 2024-05-28 DIAGNOSIS — D229 Melanocytic nevi, unspecified: Secondary | ICD-10-CM | POA: Insufficient documentation

## 2024-05-28 DIAGNOSIS — Z1159 Encounter for screening for other viral diseases: Secondary | ICD-10-CM

## 2024-05-28 DIAGNOSIS — E039 Hypothyroidism, unspecified: Secondary | ICD-10-CM

## 2024-05-28 DIAGNOSIS — R030 Elevated blood-pressure reading, without diagnosis of hypertension: Secondary | ICD-10-CM | POA: Insufficient documentation

## 2024-05-28 DIAGNOSIS — Z114 Encounter for screening for human immunodeficiency virus [HIV]: Secondary | ICD-10-CM | POA: Diagnosis not present

## 2024-05-28 DIAGNOSIS — L631 Alopecia universalis: Secondary | ICD-10-CM

## 2024-05-28 DIAGNOSIS — E663 Overweight: Secondary | ICD-10-CM

## 2024-05-28 NOTE — Assessment & Plan Note (Signed)
 Patient currently maintained on levothyroxine  150 mcg daily.  Pending TSH continue medication as prescribed

## 2024-05-28 NOTE — Progress Notes (Signed)
 Established Patient Office Visit  Subjective   Patient ID: Keith Huber, male    DOB: [birth year]  Age: 28 y.o. MRN: [id number]  Chief Complaint  Patient presents with   Annual Exam    HPI  ADHD: currently maintained on focalin  10 XR daily. He will take it as needed. He is taking it once a month    Hypothyroidism: currently maintained on levothyroxine  150mcg daily. States that he is taking it appox 80-85% of the time    for complete physical and follow up of chronic conditions.  Immunizations: -Tetanus: Completed in 2020 -Influenza:  out of season  -Shingles: too young  -Pneumonia: too young -HPV: refused   Diet: Fair diet. He is eating 2-3 meals a day. He will drink flavored water  (sprakling water ), diet sodas Exercise: No regular exercise. He was walking 5-31miles a day with work but has dropped with his current job.  He plans on getting a gym membership  Eye exam: PRN  Dental exam: Completes semi-annually    Colonoscopy: too young currently average risk . Had one done on 06/14/2022 that was clean. Repeat at standard screening age  Lung Cancer Screening: NA  PSA: too young currently average risk.   Sleep: going to bed around 12 and will get up around 750 and works at 8. Does not feel rested.  Plans on changing that when his wife starts      05/28/2024    3:45 PM 06/27/2023   10:30 AM 05/06/2023   10:44 AM  PHQ9 SCORE ONLY  PHQ-9 Total Score 5 0 0       05/28/2024    3:45 PM 06/27/2023   10:30 AM 05/06/2023   10:44 AM 03/11/2023    3:55 PM  GAD 7 : Generalized Anxiety Score  Nervous, Anxious, on Edge 0 1 0 0  Control/stop worrying 1 1 0 0  Worry too much - different things 1 0 0 0  Trouble relaxing 1 0 0 0  Restless 1 0 0 0  Easily annoyed or irritable 0 0 0 0  Afraid - awful might happen 1 0 0 0  Total GAD 7 Score 5 2 0 0  Anxiety Difficulty Somewhat difficult Not difficult at all Not difficult at all Not difficult at all          Review of Systems   Constitutional:  Negative for chills and fever.  Respiratory:  Negative for shortness of breath.   Cardiovascular:  Negative for chest pain and leg swelling.  Gastrointestinal:  Negative for abdominal pain, blood in stool, constipation, diarrhea, nausea and vomiting.       BM daily   Genitourinary:  Negative for dysuria and hematuria.  Neurological:  Negative for dizziness, tingling and headaches.  Psychiatric/Behavioral:  Negative for hallucinations and suicidal ideas.       Objective:     BP 124/88   Pulse 76   Temp 98.4 F (36.9 C) (Oral)   Ht 5' 11 (1.803 m)   Wt 189 lb 12.8 oz (86.1 kg)   SpO2 98%   BMI 26.47 kg/m  BP Readings from Last 3 Encounters:  05/28/24 124/88  11/11/23 (!) 111/96  06/27/23 118/88   Wt Readings from Last 3 Encounters:  05/28/24 189 lb 12.8 oz (86.1 kg)  11/11/23 185 lb (83.9 kg)  06/27/23 190 lb 12.8 oz (86.5 kg)   SpO2 Readings from Last 3 Encounters:  05/28/24 98%  11/11/23 100%  06/27/23 95%  Physical Exam Vitals and nursing note reviewed.  Constitutional:      Appearance: Normal appearance.  HENT:     Right Ear: Tympanic membrane, ear canal and external ear normal.     Left Ear: Tympanic membrane, ear canal and external ear normal.     Mouth/Throat:     Mouth: Mucous membranes are moist.     Pharynx: Oropharynx is clear.  Eyes:     Extraocular Movements: Extraocular movements intact.     Pupils: Pupils are equal, round, and reactive to light.  Cardiovascular:     Rate and Rhythm: Normal rate and regular rhythm.     Pulses: Normal pulses.     Heart sounds: Normal heart sounds.  Pulmonary:     Effort: Pulmonary effort is normal.     Breath sounds: Normal breath sounds.  Abdominal:     General: Bowel sounds are normal. There is no distension.     Palpations: There is no mass.     Tenderness: There is no abdominal tenderness.     Hernia: No hernia is present.  Musculoskeletal:     Right lower leg: No edema.      Left lower leg: No edema.  Lymphadenopathy:     Cervical: No cervical adenopathy.  Skin:    General: Skin is warm.      Neurological:     General: No focal deficit present.     Mental Status: He is alert.     Deep Tendon Reflexes:     Reflex Scores:      Bicep reflexes are 2+ on the right side and 2+ on the left side.      Patellar reflexes are 2+ on the right side and 2+ on the left side.    Comments: Bilateral upper and lower extremity strength 5/5  Psychiatric:        Mood and Affect: Mood normal.        Behavior: Behavior normal.        Thought Content: Thought content normal.        Judgment: Judgment normal.      No results found for any visits on 05/28/24.    The ASCVD Risk score (Arnett DK, et al., 2019) failed to calculate for the following reasons:   The 2019 ASCVD risk score is only valid for ages 57 to 31    Assessment & Plan:   Problem List Items Addressed This Visit       Endocrine   Acquired hypothyroidism   Patient currently maintained on levothyroxine  150 mcg daily.  Pending TSH continue medication as prescribed      Relevant Orders   TSH     Musculoskeletal and Integument   Alopecia universalis   History of the same stable      Benign mole     Other   Attention deficit hyperactivity disorder (ADHD), combined type   History of the same.  Patient is using medication infrequently approximately once a month.      Relevant Orders   TSH   Overweight   Pending TSH, A1c, lipid panel      Preventative health care - Primary   Discussed age-appropriate immunizations and screening exams.  Did review patient's personal, surgical, social, family histories.  Patient is up-to-date on all age-appropriate vaccinations he would like.  Patient declined HPV vaccines today.  Patient is too young for CRC screening or prostate cancer screening.  Patient was given information at discharge about preventative healthcare maintenance with  anticipatory guidance       Relevant Orders   CBC   Comprehensive metabolic panel with GFR   Hyperlipidemia   History of the same pending lipid panel      Relevant Orders   Lipid panel   Prediabetes   History of the same.  Pending A1c today patient has been working on lifestyle modifications inclusive of diet      Relevant Orders   Hemoglobin A1c   Elevated blood pressure reading in office without diagnosis of hypertension   Other Visit Diagnoses       Encounter for hepatitis C screening test for low risk patient       Relevant Orders   Hepatitis C antibody     Encounter for screening for HIV       Relevant Orders   HIV antibody (with reflex)       Return in about 1 year (around 05/28/2025) for CPE and Labs.    Adina Crandall, NP

## 2024-05-28 NOTE — Assessment & Plan Note (Signed)
 Pending TSH, A1c, lipid panel.

## 2024-05-28 NOTE — Assessment & Plan Note (Signed)
 History of the same.  Patient is using medication infrequently approximately once a month.

## 2024-05-28 NOTE — Assessment & Plan Note (Signed)
History of the same pending lipid panel

## 2024-05-28 NOTE — Progress Notes (Signed)
 Established Patient Office Visit  Subjective   Patient ID: Tregan Read, male    DOB: Nov 10, 1996  Age: 28 y.o. MRN: [id number]  Chief Complaint  Patient presents with   Annual Exam    HPI  ADHD: currently maintained on focalin  10 XR daily. He will take it as needed. He is taking it once a month    Hypothyroidism: currently maintained on levothyroxine  150mcg daily   for complete physical and follow up of chronic conditions.  Immunizations: -Tetanus: Completed in 2020 -Influenza:  out of season  -Shingles: too young  -Pneumonia: too young -HPV: refused   Diet: Fair diet. He is eating 2-3 meals a day. He will drink flavored water  (sprakling water ), diet sodas Exercise: No regular exercise. He was walking 5-63miles a day with work but has dropped. He plans on getting a gym membership  Eye exam: PRN  Dental exam: Completes semi-annually    Colonoscopy: too young currently average risk   Lung Cancer Screening: NA  PSA: too young currently average risk   Sleep: going to bed around 12 and will get up around 750 and works at 8. Does not feel rested.  Plans on changing that when his wife starts      05/28/2024    3:45 PM 06/27/2023   10:30 AM 05/06/2023   10:44 AM  PHQ9 SCORE ONLY  PHQ-9 Total Score 5 0 0       05/28/2024    3:45 PM 06/27/2023   10:30 AM 05/06/2023   10:44 AM 03/11/2023    3:55 PM  GAD 7 : Generalized Anxiety Score  Nervous, Anxious, on Edge 0 1 0 0  Control/stop worrying 1 1 0 0  Worry too much - different things 1 0 0 0  Trouble relaxing 1 0 0 0  Restless 1 0 0 0  Easily annoyed or irritable 0 0 0 0  Afraid - awful might happen 1 0 0 0  Total GAD 7 Score 5 2 0 0  Anxiety Difficulty Somewhat difficult Not difficult at all Not difficult at all Not difficult at all          Review of Systems  Constitutional:  Negative for chills and fever.  Respiratory:  Negative for shortness of breath.   Cardiovascular:  Negative for chest pain and leg  swelling.  Gastrointestinal:  Negative for abdominal pain, blood in stool, constipation, diarrhea, nausea and vomiting.       BM daily   Genitourinary:  Negative for dysuria and hematuria.  Neurological:  Negative for dizziness, tingling and headaches.  Psychiatric/Behavioral:  Negative for hallucinations and suicidal ideas.       Objective:     BP (!) 130/96   Pulse 76   Temp 98.4 F (36.9 C) (Oral)   Ht 5' 11 (1.803 m)   Wt 189 lb 12.8 oz (86.1 kg)   SpO2 98%   BMI 26.47 kg/m  BP Readings from Last 3 Encounters:  05/28/24 (!) 130/96  11/11/23 (!) 111/96  06/27/23 118/88   Wt Readings from Last 3 Encounters:  05/28/24 189 lb 12.8 oz (86.1 kg)  11/11/23 185 lb (83.9 kg)  06/27/23 190 lb 12.8 oz (86.5 kg)   SpO2 Readings from Last 3 Encounters:  05/28/24 98%  11/11/23 100%  06/27/23 95%      Physical Exam Vitals and nursing note reviewed.  Constitutional:      Appearance: Normal appearance.  HENT:     Right Ear: Tympanic membrane, ear  canal and external ear normal.     Left Ear: Tympanic membrane, ear canal and external ear normal.     Mouth/Throat:     Mouth: Mucous membranes are moist.     Pharynx: Oropharynx is clear.  Eyes:     Extraocular Movements: Extraocular movements intact.     Pupils: Pupils are equal, round, and reactive to light.  Cardiovascular:     Rate and Rhythm: Normal rate and regular rhythm.     Pulses: Normal pulses.     Heart sounds: Normal heart sounds.  Pulmonary:     Effort: Pulmonary effort is normal.     Breath sounds: Normal breath sounds.  Abdominal:     General: Bowel sounds are normal. There is no distension.     Palpations: There is no mass.     Tenderness: There is no abdominal tenderness.     Hernia: No hernia is present.  Musculoskeletal:     Right lower leg: No edema.     Left lower leg: No edema.  Lymphadenopathy:     Cervical: No cervical adenopathy.  Skin:    General: Skin is warm.  Neurological:      General: No focal deficit present.     Mental Status: He is alert.     Deep Tendon Reflexes:     Reflex Scores:      Bicep reflexes are 2+ on the right side and 2+ on the left side.      Patellar reflexes are 2+ on the right side and 2+ on the left side.    Comments: Bilateral upper and lower extremity strength 5/5  Psychiatric:        Mood and Affect: Mood normal.        Behavior: Behavior normal.        Thought Content: Thought content normal.        Judgment: Judgment normal.      No results found for any visits on 05/28/24.    The ASCVD Risk score (Arnett DK, et al., 2019) failed to calculate for the following reasons:   The 2019 ASCVD risk score is only valid for ages 75 to 70    Assessment & Plan:   Problem List Items Addressed This Visit       Endocrine   Acquired hypothyroidism     Musculoskeletal and Integument   Alopecia universalis     Other   Attention deficit hyperactivity disorder (ADHD), combined type   Overweight   Preventative health care   Hyperlipidemia   Prediabetes   Other Visit Diagnoses       Encounter for hepatitis C screening test for low risk patient    -  Primary     Encounter for screening for HIV           No follow-ups on file.    Adina Crandall, NP

## 2024-05-28 NOTE — Assessment & Plan Note (Signed)
 Discussed age-appropriate immunizations and screening exams.  Did review patient's personal, surgical, social, family histories.  Patient is up-to-date on all age-appropriate vaccinations he would like.  Patient declined HPV vaccines today.  Patient is too young for CRC screening or prostate cancer screening.  Patient was given information at discharge about preventative healthcare maintenance with anticipatory guidance

## 2024-05-28 NOTE — Patient Instructions (Signed)
 Nice to see you today I will be in touch with the labs once I have them Follow up with me in 1 year, sooner if you need me

## 2024-05-28 NOTE — Assessment & Plan Note (Signed)
 History of the same.  Pending A1c today patient has been working on lifestyle modifications inclusive of diet

## 2024-05-28 NOTE — Assessment & Plan Note (Signed)
 History of the same stable

## 2024-05-29 LAB — COMPREHENSIVE METABOLIC PANEL WITH GFR
ALT: 26 U/L (ref 0–53)
AST: 19 U/L (ref 0–37)
Albumin: 4.9 g/dL (ref 3.5–5.2)
Alkaline Phosphatase: 79 U/L (ref 39–117)
BUN: 14 mg/dL (ref 6–23)
CO2: 24 meq/L (ref 19–32)
Calcium: 10.1 mg/dL (ref 8.4–10.5)
Chloride: 102 meq/L (ref 96–112)
Creatinine, Ser: 0.95 mg/dL (ref 0.40–1.50)
GFR: 107.51 mL/min (ref >60.00)
Glucose, Bld: 88 mg/dL (ref 70–99)
Potassium: 3.9 meq/L (ref 3.5–5.1)
Sodium: 137 meq/L (ref 135–145)
Total Bilirubin: 0.5 mg/dL (ref 0.2–1.2)
Total Protein: 7.7 g/dL (ref 6.0–8.3)

## 2024-05-29 LAB — HEMOGLOBIN A1C: Hgb A1c MFr Bld: 6.1 % (ref 4.6–6.5)

## 2024-05-29 LAB — LIPID PANEL
Cholesterol: 195 mg/dL (ref 0–200)
HDL: 47.1 mg/dL (ref >39.00)
LDL Cholesterol: 130 mg/dL — ABNORMAL HIGH (ref 0–99)
NonHDL: 148.35
Total CHOL/HDL Ratio: 4
Triglycerides: 92 mg/dL (ref 0.0–149.0)
VLDL: 18.4 mg/dL (ref 0.0–40.0)

## 2024-05-29 LAB — CBC
HCT: 40.7 % (ref 39.0–52.0)
Hemoglobin: 13.9 g/dL (ref 13.0–17.0)
MCHC: 34 g/dL (ref 30.0–36.0)
MCV: 84 fl (ref 78.0–100.0)
Platelets: 351 K/uL (ref 150.0–400.0)
RBC: 4.85 Mil/uL (ref 4.22–5.81)
RDW: 13.4 % (ref 11.5–15.5)
WBC: 5.3 K/uL (ref 4.0–10.5)

## 2024-05-29 LAB — HIV ANTIBODY (ROUTINE TESTING W REFLEX): HIV 1&2 Ab, 4th Generation: NONREACTIVE

## 2024-05-29 LAB — HEPATITIS C ANTIBODY: Hepatitis C Ab: NONREACTIVE

## 2024-05-29 LAB — TSH: TSH: 2.97 u[IU]/mL (ref 0.35–5.50)

## 2024-05-30 ENCOUNTER — Ambulatory Visit: Payer: Self-pay | Admitting: Nurse Practitioner

## 2024-06-13 ENCOUNTER — Other Ambulatory Visit: Payer: Self-pay | Admitting: Nurse Practitioner

## 2024-06-13 DIAGNOSIS — E039 Hypothyroidism, unspecified: Secondary | ICD-10-CM

## 2025-05-20 ENCOUNTER — Other Ambulatory Visit

## 2025-05-29 ENCOUNTER — Encounter: Admitting: Nurse Practitioner
